# Patient Record
Sex: Female | Born: 1970
Health system: Southern US, Community
[De-identification: ages and names within clinical notes are randomized; demographics above are authoritative.]

## PROBLEM LIST (undated history)

## (undated) DIAGNOSIS — F419 Anxiety disorder, unspecified: Secondary | ICD-10-CM

## (undated) DIAGNOSIS — I10 Essential (primary) hypertension: Secondary | ICD-10-CM

## (undated) DIAGNOSIS — K219 Gastro-esophageal reflux disease without esophagitis: Secondary | ICD-10-CM

## (undated) DIAGNOSIS — R011 Cardiac murmur, unspecified: Secondary | ICD-10-CM

## (undated) HISTORY — PX: SPINE SURGERY: SHX786

## (undated) HISTORY — DX: Anxiety disorder, unspecified: F41.9

## (undated) HISTORY — DX: Essential (primary) hypertension: I10

## (undated) HISTORY — DX: Cardiac murmur, unspecified: R01.1

## (undated) HISTORY — DX: Gastro-esophageal reflux disease without esophagitis: K21.9

---

## 2001-09-20 ENCOUNTER — Ambulatory Visit (HOSPITAL_COMMUNITY): Admission: RE | Admit: 2001-09-20 | Discharge: 2001-09-20 | Payer: Self-pay | Admitting: Internal Medicine

## 2003-07-25 ENCOUNTER — Emergency Department (HOSPITAL_COMMUNITY): Admission: EM | Admit: 2003-07-25 | Discharge: 2003-07-25 | Payer: Self-pay | Admitting: Emergency Medicine

## 2008-03-08 ENCOUNTER — Ambulatory Visit (HOSPITAL_COMMUNITY): Admission: RE | Admit: 2008-03-08 | Discharge: 2008-03-08 | Payer: Self-pay | Admitting: Obstetrics and Gynecology

## 2008-05-29 ENCOUNTER — Encounter: Admission: RE | Admit: 2008-05-29 | Discharge: 2008-05-29 | Payer: Self-pay | Admitting: Obstetrics and Gynecology

## 2009-06-11 ENCOUNTER — Encounter: Admission: RE | Admit: 2009-06-11 | Discharge: 2009-06-11 | Payer: Self-pay | Admitting: Obstetrics and Gynecology

## 2009-12-26 ENCOUNTER — Ambulatory Visit (HOSPITAL_COMMUNITY): Admission: RE | Admit: 2009-12-26 | Discharge: 2009-12-26 | Payer: Self-pay | Admitting: Internal Medicine

## 2010-01-02 ENCOUNTER — Ambulatory Visit (HOSPITAL_COMMUNITY): Admission: RE | Admit: 2010-01-02 | Discharge: 2010-01-02 | Payer: Self-pay | Admitting: Internal Medicine

## 2011-02-13 NOTE — Procedures (Signed)
   NAMECHARLY, HUNTON                        ACCOUNT NO.:  [id number]   MEDICAL RECORD NO.:  [id number]                   PATIENT TYPE:  EMS   LOCATION:  ED                                   FACILITY:  [facility]   PHYSICIAN:  Edward L. Juanetta Gosling, M.D.             DATE OF BIRTH:  [birth year]   DATE OF PROCEDURE:  07/25/2003  DATE OF DISCHARGE:  07/25/2003                                EKG INTERPRETATION   TIME:  Is 3:09 on July 25, 2003.   The rhythm is sinus rhythm with a tachycardic rate of about 101.  Otherwise  normal electrocardiogram.      ___________________________________________                                            Oneal Deputy. Juanetta Gosling, M.D.   ELH/MEDQ  D:  07/25/2003  T:  07/25/2003  Job:  [id number]

## 2013-09-28 HISTORY — PX: BACK SURGERY: SHX140

## 2014-05-24 ENCOUNTER — Other Ambulatory Visit (HOSPITAL_COMMUNITY): Payer: Self-pay | Admitting: Physician Assistant

## 2014-05-24 DIAGNOSIS — M5430 Sciatica, unspecified side: Secondary | ICD-10-CM

## 2014-05-24 DIAGNOSIS — M479 Spondylosis, unspecified: Secondary | ICD-10-CM

## 2014-05-29 ENCOUNTER — Ambulatory Visit (HOSPITAL_COMMUNITY)
Admission: RE | Admit: 2014-05-29 | Discharge: 2014-05-29 | Disposition: A | Discharge status: Home / Self Care | Payer: BC Managed Care – PPO | Source: Ambulatory Visit | Attending: Physician Assistant | Admitting: Physician Assistant

## 2014-05-29 DIAGNOSIS — M479 Spondylosis, unspecified: Secondary | ICD-10-CM | POA: Diagnosis present

## 2014-05-29 DIAGNOSIS — M5126 Other intervertebral disc displacement, lumbar region: Secondary | ICD-10-CM | POA: Diagnosis not present

## 2014-05-29 DIAGNOSIS — N859 Noninflammatory disorder of uterus, unspecified: Secondary | ICD-10-CM | POA: Insufficient documentation

## 2014-05-29 DIAGNOSIS — M5430 Sciatica, unspecified side: Secondary | ICD-10-CM | POA: Diagnosis not present

## 2014-06-06 ENCOUNTER — Other Ambulatory Visit: Payer: Self-pay | Admitting: Neurosurgery

## 2014-06-06 DIAGNOSIS — M545 Low back pain, unspecified: Secondary | ICD-10-CM

## 2014-06-06 DIAGNOSIS — G8929 Other chronic pain: Secondary | ICD-10-CM

## 2014-06-08 ENCOUNTER — Other Ambulatory Visit: Payer: BC Managed Care – PPO

## 2014-06-14 ENCOUNTER — Other Ambulatory Visit: Payer: Self-pay | Admitting: Neurosurgery

## 2014-06-14 ENCOUNTER — Ambulatory Visit
Admission: RE | Admit: 2014-06-14 | Discharge: 2014-06-14 | Disposition: A | Discharge status: Home / Self Care | Payer: BC Managed Care – PPO | Source: Ambulatory Visit | Attending: Neurosurgery | Admitting: Neurosurgery

## 2014-06-14 VITALS — BP 127/87 | HR 72

## 2014-06-14 DIAGNOSIS — G8929 Other chronic pain: Secondary | ICD-10-CM

## 2014-06-14 DIAGNOSIS — M545 Low back pain, unspecified: Secondary | ICD-10-CM

## 2014-06-14 DIAGNOSIS — M5126 Other intervertebral disc displacement, lumbar region: Secondary | ICD-10-CM

## 2014-06-14 MED ORDER — IOHEXOL 180 MG/ML  SOLN
1.0000 mL | Freq: Once | INTRAMUSCULAR | Status: AC | PRN
Start: 2014-06-14 — End: 2014-06-14
  Administered 2014-06-14: 1 mL via EPIDURAL

## 2014-06-14 MED ORDER — METHYLPREDNISOLONE ACETATE 40 MG/ML INJ SUSP (RADIOLOG
120.0000 mg | Freq: Once | INTRAMUSCULAR | Status: AC
  Administered 2014-06-14: 120 mg via EPIDURAL

## 2014-06-14 NOTE — Discharge Instructions (Signed)

## 2014-07-09 ENCOUNTER — Other Ambulatory Visit: Payer: Self-pay | Admitting: Neurosurgery

## 2014-07-09 DIAGNOSIS — M5126 Other intervertebral disc displacement, lumbar region: Secondary | ICD-10-CM

## 2014-07-11 ENCOUNTER — Ambulatory Visit
Admission: RE | Admit: 2014-07-11 | Discharge: 2014-07-11 | Disposition: A | Discharge status: Home / Self Care | Payer: BC Managed Care – PPO | Source: Ambulatory Visit | Attending: Neurosurgery | Admitting: Neurosurgery

## 2014-07-11 DIAGNOSIS — M5126 Other intervertebral disc displacement, lumbar region: Secondary | ICD-10-CM

## 2014-07-11 MED ORDER — IOHEXOL 180 MG/ML  SOLN
1.0000 mL | Freq: Once | INTRAMUSCULAR | Status: AC | PRN
  Administered 2014-07-11: 1 mL via EPIDURAL

## 2014-07-11 MED ORDER — METHYLPREDNISOLONE ACETATE 40 MG/ML INJ SUSP (RADIOLOG
120.0000 mg | Freq: Once | INTRAMUSCULAR | Status: AC
  Administered 2014-07-11: 120 mg via EPIDURAL

## 2014-07-16 ENCOUNTER — Other Ambulatory Visit: Payer: BC Managed Care – PPO

## 2015-01-04 ENCOUNTER — Ambulatory Visit (HOSPITAL_COMMUNITY): Payer: BLUE CROSS/BLUE SHIELD | Attending: Neurosurgery | Admitting: Physical Therapy

## 2015-01-04 DIAGNOSIS — M5126 Other intervertebral disc displacement, lumbar region: Secondary | ICD-10-CM

## 2015-01-04 DIAGNOSIS — M6281 Muscle weakness (generalized): Secondary | ICD-10-CM | POA: Insufficient documentation

## 2015-01-04 DIAGNOSIS — M545 Low back pain: Secondary | ICD-10-CM | POA: Insufficient documentation

## 2015-01-04 DIAGNOSIS — R29898 Other symptoms and signs involving the musculoskeletal system: Secondary | ICD-10-CM

## 2015-01-04 NOTE — Therapy (Signed)
[facility] [address] [place], [place], [address] Phone: [phone]   Fax:  [phone]  Physical Therapy Evaluation  Patient Details  Name: Brittany Winters MRN: [id number] Date of Birth: 14-Apr-1971 Referring Provider:  Hilda Lias, MD  Encounter Date: 01/04/2015      PT End of Session - 01/04/15 1419    Visit Number 1   Number of Visits 8   Date for PT Re-Evaluation 03/05/15   Authorization Type BCBS   PT Start Time 1345   PT Stop Time 1430   PT Time Calculation (min) 45 min   Activity Tolerance Patient tolerated treatment well      No past medical history on file.  No past surgical history on file.  There were no vitals filed for this visit.  Visit Diagnosis:  HNP (herniated nucleus pulposus), lumbar  Weakness of left leg      Subjective Assessment - 01/04/15 1347    Subjective Pt states that she had back surgery on 09/26/14.  Initially the surgery helped but at  this time she states the pain and stiffness has incresed to pre-surgery level therefore she is being referred to skilled physical therapy.   How long can you sit comfortably? Pt states that prolong periods, (greater than 2 hours) increases her discomfort.    How long can you stand comfortably? no problem   How long can you walk comfortably? walking 45 minutes at least 3 times a week.    Patient Stated Goals to have less stiffness, learn exercises she can do to decrease her back pain.    Currently in Pain? No/denies  tightnes of low back             [facility] PT Assessment - 01/04/15 0001    Assessment   Medical Diagnosis HNP with surgery   Onset Date 09/26/14   Prior Therapy none after surgery    Precautions   Precautions None   Restrictions   Weight Bearing Restrictions No   Balance Screen   Has the patient fallen in the past 6 months No   Has the patient had a decrease in activity level because of a fear of falling?  No   Is the patient  reluctant to leave their home because of a fear of falling?  No   Prior Function   Level of Independence Independent with basic ADLs   Vocation Full time employment   Vocation Requirements 9 hours sitting, standing    Leisure walk, yoga    Cognition   Overall Cognitive Status Within Functional Limits for tasks assessed   Observation/Other Assessments   Focus on Therapeutic Outcomes (FOTO)  61   AROM   Lumbar Flexion decreased 20%   Lumbar Extension decreased 20%   Lumbar - Right Side Bend wfl   Lumbar - Left Side Bend wfl   Lumbar - Right Rotation decreased 20%   Lumbar - Left Rotation decreased 20   Strength   Left Hip Flexion 4/5   Left Hip Extension 3+/5   Left Hip ABduction 5/5   Left Knee Flexion 4/5   Left Knee Extension 3+/5   Left Ankle Dorsiflexion 3/5   Flexibility   Soft Tissue Assessment /Muscle Length yes   Hamstrings Lt 125                    [facility] Adult PT Treatment/Exercise - 01/04/15 0001    Exercises   Exercises Lumbar   Lumbar Exercises:  Stretches   Active Hamstring Stretch 2 reps;30 seconds   Active Hamstring Stretch Limitations supine   Single Knee to Chest Stretch 3 reps;30 seconds   Prone on Elbows Stretch 2 reps;60 seconds   Lumbar Exercises: Standing   Other Standing Lumbar Exercises 3 D hip excursion    Lumbar Exercises: Seated   Long Arc Quad on Chair Left;10 reps   Other Seated Lumbar Exercises toe raise x 10   Lumbar Exercises: Supine   Ab Set 10 reps   Bridge 10 reps                PT Education - 01/04/15 1418    Education provided Yes   Education Details body mechanics, strengthening and stretcihing exercises   Person(s) Educated Patient   Methods Explanation;Handout   Comprehension Verbalized understanding;Returned demonstration          PT Short Term Goals - 01/04/15 1422    PT SHORT TERM GOAL #1   Title I HEP   Time 1   Period Weeks   PT SHORT TERM GOAL #2   Title No complaint of stiffness   Time 2    Period Weeks   Status New           PT Long Term Goals - 01/04/15 1422    PT LONG TERM GOAL #1   Title I in advance HEP   Time 4   Period Weeks   PT LONG TERM GOAL #2   Title Pt able to demonstrate good body mechanics for lifting, pushing   Time 4   Period Weeks   PT LONG TERM GOAL #3   Title Pt LE strength to return to normal to reduce risk of reinjury    Time 4   Period Weeks               Plan - 01/04/15 1425    Clinical Impression Statement Ms. Brittany Winters is a 44 yo female who has had recent back surgery.  She was doing well but is starting to experience more pain and stiffness therefore her MD referred her to physical therapy.  Examination demonstrates decreased LE strength, decreased ROM and decreased knowledge on proper body mechanics.  Ms. Brittany Winters will benefit from skilled PT to address these issues and reduce the risk of  reinjury.     Pt will benefit from skilled therapeutic intervention in order to improve on the following deficits Decreased range of motion;Decreased strength;Improper body mechanics;Impaired flexibility   Rehab Potential Good   PT Frequency 2x / week   PT Duration 4 weeks   PT Treatment/Interventions Functional mobility training;Therapeutic activities;Therapeutic exercise;Balance training;Patient/family education   PT Next Visit Plan Begin standing hell raises; functional squats, prone heel squeeze, prone SLR, SAR pt will need pictures of these exercises;  Progress to t-band and lifting as well as yoga poses   PT Home Exercise Plan given   Consulted and Agree with Plan of Care Patient         Problem List There are no active problems to display for this patient.  Virgina OrganCynthia Russell, PT CLT [phone] 01/04/2015, 2:36 PM  [facility] [address] [place], [place], [address] Phone: [phone]   Fax:  [phone]

## 2015-01-04 NOTE — Patient Instructions (Signed)
Toe Raise (Sitting)   Raise toes, keeping heels on floor. Repeat ___20_ times per set. Do __1__ sets per session. Do 2____ sessions per day.  http://orth.exer.us/47   Copyright  VHI. All rights reserved.  Knee Extension (Sitting)   Place _0___ pound weight on left ankle and straighten knee fully, lower slowly. Repeat _15___ times per set. Do ___1_ sets per session. Do 2____ sessions per day.  http://orth.exer.us/732   Copyright  VHI. All rights reserved.  Knee-to-Chest Stretch: Unilateral   With hand behind right knee, pull knee in to chest until a comfortable stretch is felt in lower back and buttocks. Keep back relaxed. Hold _30___ seconds. Repeat __3__ times per set. Do _2___ sets per session. Do __3__ sessions per day.  http://orth.exer.us/126   Copyright  VHI. All rights reserved.  Hamstring Stretch: Active   Support behind right knee. Starting with knee bent, attempt to straighten knee until a comfortable stretch is felt in back of thigh. Hold __30__ seconds. Repeat __3__ times per set. Do __1__ sets per session. Do _2___ sessions per day.  http://orth.exer.us/158   Copyright  VHI. All rights reserved.  Isometric Abdominal   Lying on back with knees bent, tighten stomach by pressing elbows down. Hold __3__ seconds. Repeat _5-10___ times per set. Do __1__ sets per session. Do _2___ sessions per day.  http://orth.exer.us/1086   Copyright  VHI. All rights reserved.  Bridging   Slowly raise buttocks from floor, keeping stomach tight. Repeat _10___ times per set. Do _1___ sets per session. Do __2__ sessions per day.  http://orth.exer.us/1096   Copyright  VHI. All rights reserved.  On Elbows (Prone)   Rise up on elbows as high as possible, keeping hips on floor. Hold _180___ seconds. Repeat ____ times per set. Do __1__ sets per session. Do ___1_ sessions per day. 1 http://orth.exer.us/92   Copyright  VHI. All rights reserved.

## 2015-01-16 ENCOUNTER — Encounter (HOSPITAL_COMMUNITY): Payer: BLUE CROSS/BLUE SHIELD | Admitting: Physical Therapy

## 2015-01-23 ENCOUNTER — Ambulatory Visit (HOSPITAL_COMMUNITY): Payer: BLUE CROSS/BLUE SHIELD | Admitting: Physical Therapy

## 2015-01-23 ENCOUNTER — Telehealth (HOSPITAL_COMMUNITY): Payer: Self-pay | Admitting: Physical Therapy

## 2015-01-30 ENCOUNTER — Ambulatory Visit (HOSPITAL_COMMUNITY): Payer: BLUE CROSS/BLUE SHIELD | Attending: Neurosurgery | Admitting: Physical Therapy

## 2015-01-30 DIAGNOSIS — M545 Low back pain: Secondary | ICD-10-CM | POA: Insufficient documentation

## 2015-01-30 DIAGNOSIS — M5126 Other intervertebral disc displacement, lumbar region: Secondary | ICD-10-CM

## 2015-01-30 DIAGNOSIS — M6281 Muscle weakness (generalized): Secondary | ICD-10-CM | POA: Insufficient documentation

## 2015-01-30 DIAGNOSIS — R29898 Other symptoms and signs involving the musculoskeletal system: Secondary | ICD-10-CM

## 2015-01-30 NOTE — Therapy (Signed)
[facility] [address] [place], [place], [address] Phone: [phone]   Fax:  [phone]  Physical Therapy Treatment  Patient Details  Name: Brittany AlbrightMiranda P Nowaczyk MRN: [id number] Date of Birth: 04/22/1971 Referring Provider:  Hilda LiasBotero, Ernesto, MD  Encounter Date: 01/30/2015      PT End of Session - 01/30/15 0934    Visit Number 2   Number of Visits 8   Date for PT Re-Evaluation 03/05/15   Authorization Type BCBS   PT Start Time 0854   PT Stop Time 0937   PT Time Calculation (min) 43 min   Activity Tolerance Patient tolerated treatment well      No past medical history on file.  No past surgical history on file.  There were no vitals filed for this visit.  Visit Diagnosis:  HNP (herniated nucleus pulposus), lumbar  Weakness of left leg      Subjective Assessment - 01/30/15 0902    Subjective Pt states she has been doing her exercises and has no questions   Currently in Pain? No/denies               [facility] Adult PT Treatment/Exercise - 01/30/15 0001    Lumbar Exercises: Stretches   Active Hamstring Stretch 2 reps;30 seconds   Active Hamstring Stretch Limitations supine   Single Knee to Chest Stretch 3 reps;30 seconds   Prone on Elbows Stretch 2 reps;60 seconds   Press Ups 5 reps   Lumbar Exercises: Standing   Heel Raises 10 reps   Functional Squats 10 reps   Wall Slides 10 reps   Lumbar Exercises: Seated   Sit to Stand 10 reps   Lumbar Exercises: Prone   Single Arm Raise 10 reps   Straight Leg Raise 10 reps   Opposite Arm/Leg Raise Right arm/Left leg;Left arm/Right leg;10 reps                PT Education - 01/30/15 0934    Education provided Yes   Education Details for new exercises   Person(s) Educated Patient   Methods Explanation;Handout   Comprehension Verbalized understanding;Returned demonstration          PT Short Term Goals - 01/04/15 1422    PT SHORT TERM GOAL #1   Title I HEP   Time 1   Period Weeks   PT SHORT TERM GOAL #2   Title No complaint of stiffness   Time 2   Period Weeks   Status New           PT Long Term Goals - 01/04/15 1422    PT LONG TERM GOAL #1   Title I in advance HEP   Time 4   Period Weeks   PT LONG TERM GOAL #2   Title Pt able to demonstrate good body mechanics for lifting, pushing   Time 4   Period Weeks   PT LONG TERM GOAL #3   Title Pt LE strength to return to normal to reduce risk of reinjury    Time 4   Period Weeks            Plan - 01/30/15 [id number]    Clinical Impression Statement Pt added new prone exercises with minimal verbal and tactile cuing needed for proper technique.  Increased difficulty with transisitonal,(sit to stand) motion.  All exercises were facilitated by therapist to ensure proper technique.    Rehab Potential Good   PT Next Visit Plan begin postural t-band and all  4 exercises next treatment         Problem List There are no active problems to display for this patient. Virgina OrganCynthia Johnattan Strassman, PT CLT [phone] 01/30/2015, 9:37 AM  [facility] [address] [place], [place], [address] Phone: [phone]   Fax:  [phone]

## 2015-01-30 NOTE — Patient Instructions (Signed)
Heel Squeeze (Prone)   Abdomen supported, bend knees and gently squeeze heels together. Hold 3-5____ seconds. Repeat _10___ times per set. Do ____ sets per session. Do _1___ sessions per day.  http://orth.exer.us/1080   Copyright  VHI. All rights reserved.  Straight Leg Raise (Prone)   Abdomen and head supported, keep left knee locked and raise leg at hip. Avoid arching low back. Repeat 10____ times per set. Do __1__ sets per session. Do __2__ sessions per day.  http://orth.exer.us/1112   Copyright  VHI. All rights reserved.  Opposite Arm / Leg Lift (Prone)   Abdomen and head supported, left knee locked, raise leg and opposite arm __2__ inches from floor. Repeat __10__ times per set. Do _1___ sets per session. Do _10___ sessions per day.  http://orth.exer.us/1114   Copyright  VHI. All rights reserved.  Scapular Retraction: Flexion (Prone)   Lie with arms forward. Pinch shoulder blades together and raise arms a few inches from floor. Repeat _10___ times per set. Do ____ sets per session. Do __2__ sessions per day. 1 http://orth.exer.us/960   Copyright  VHI. All rights reserved.  Press-Up   Press upper body upward, keeping hips in contact with floor. Keep lower back and buttocks relaxed. Hold __2-3__ seconds. Repeat ___10_ times per set. Do __1__ sets per session. Do _1___ sessions per day.  http://orth.exer.us/94   Copyright  VHI. All rights reserved.  Sitting to Standing   With straight back, tighten stomach, place right leg back under chair, lean slightly forward and stand. Repeat __10_ times per set. Do ____1 sets per session. Do ___2_ sessions per day.  http://orth.exer.us/1140

## 2015-02-01 ENCOUNTER — Encounter (HOSPITAL_COMMUNITY): Payer: BLUE CROSS/BLUE SHIELD

## 2015-02-05 ENCOUNTER — Ambulatory Visit (HOSPITAL_COMMUNITY): Payer: BLUE CROSS/BLUE SHIELD | Admitting: Physical Therapy

## 2015-02-07 ENCOUNTER — Ambulatory Visit (HOSPITAL_COMMUNITY): Payer: BLUE CROSS/BLUE SHIELD | Admitting: Physical Therapy

## 2015-02-12 ENCOUNTER — Encounter (HOSPITAL_COMMUNITY): Payer: BLUE CROSS/BLUE SHIELD | Admitting: Physical Therapy

## 2015-02-14 ENCOUNTER — Ambulatory Visit (HOSPITAL_COMMUNITY): Payer: BLUE CROSS/BLUE SHIELD | Admitting: Physical Therapy

## 2015-02-19 ENCOUNTER — Ambulatory Visit (HOSPITAL_COMMUNITY): Payer: BLUE CROSS/BLUE SHIELD | Admitting: Physical Therapy

## 2015-02-19 DIAGNOSIS — M545 Low back pain: Secondary | ICD-10-CM | POA: Diagnosis not present

## 2015-02-19 DIAGNOSIS — M5126 Other intervertebral disc displacement, lumbar region: Secondary | ICD-10-CM

## 2015-02-19 DIAGNOSIS — R29898 Other symptoms and signs involving the musculoskeletal system: Secondary | ICD-10-CM

## 2015-02-19 NOTE — Therapy (Signed)
[facility] [address] [place], [place], [address] Phone: [phone]   Fax:  [phone]  Physical Therapy Treatment  Patient Details  Name: Brittany Winters MRN: [id number] Date of Birth: 1971/04/09 Referring Provider:  Leeroy Cha, MD  Encounter Date: 02/19/2015      PT End of Session - 02/19/15 1347    Visit Number 3   Number of Visits 8   Date for PT Re-Evaluation 03/05/15   Authorization Type BCBS   PT Start Time 8841   PT Stop Time 1345   PT Time Calculation (min) 42 min   Activity Tolerance Patient tolerated treatment well      No past medical history on file.  No past surgical history on file.  There were no vitals filed for this visit.  Visit Diagnosis:  HNP (herniated nucleus pulposus), lumbar  Weakness of left leg          [facility] PT Assessment - 02/19/15 0001    Observation/Other Assessments   Focus on Therapeutic Outcomes (FOTO)  66   Strength   Left Hip Flexion 5/5  was 4/5   Left Hip Extension 5/5  was3+/5   Left Hip ABduction 5/5   Left Knee Flexion 5/5  was 4/5    Left Knee Extension 5/5   Left Ankle Dorsiflexion 5/5  was 3/5   Flexibility   Hamstrings Lt 135 was 125                      [facility] Adult PT Treatment/Exercise - 02/19/15 0001    Lumbar Exercises: Stretches   Active Hamstring Stretch 3 reps;30 seconds   Active Hamstring Stretch Limitations supine   Single Knee to Chest Stretch 3 reps;30 seconds   Lumbar Exercises: Standing   Scapular Retraction Strengthening;Theraband   Theraband Level (Scapular Retraction) Level 4 (Blue)   Row Strengthening;Both;10 reps;Theraband   Theraband Level (Row) Level 4 (Blue)   Shoulder Extension Strengthening;Both;Theraband   Theraband Level (Shoulder Extension) Level 4 (Blue)   Lumbar Exercises: Prone   Opposite Arm/Leg Raise Right arm/Left leg;Left arm/Right leg;10 reps   Other Prone Lumbar Exercises Plank 10'"  5                  PT Education - 02/19/15 1347    Education Details for new exercises   Person(s) Educated Patient   Methods Explanation;Handout   Comprehension Verbalized understanding;Returned demonstration          PT Short Term Goals - 02/19/15 1326    PT SHORT TERM GOAL #1   Title I HEP   Time 1   Period Weeks   PT SHORT TERM GOAL #2   Title No complaint of stiffness   Time 2   Period Weeks   Status Achieved           PT Long Term Goals - 02/19/15 1327    PT LONG TERM GOAL #1   Title I in advance HEP   Time 4   Period Weeks   Status Achieved   PT LONG TERM GOAL #2   Title Pt able to demonstrate good body mechanics for lifting, pushing   Time 4   Period Weeks   Status Achieved   PT LONG TERM GOAL #3   Title Pt LE strength to return to normal to reduce risk of reinjury    Time 4   Period Weeks   Status Achieved  Plan - 02/19/15 1348    Clinical Impression Statement Reviewed postural t-band, lunging and plank with pt with manual and verbal cuing for proper technique and form.   Pt has met all goals and strengh has returned to normal.  Wlll discharge pt to HEP    PT Next Visit Plan D/C to HEP         Problem List There are no active problems to display for this patient.  Rayetta Humphrey, PT CLT [phone] 02/19/2015, 1:50 PM  PHYSICAL THERAPY DISCHARGE SUMMARY  Plan: Patient agrees to discharge.  Patient goals were met. Patient is being discharged due to meeting the stated rehab goals.  ?????     Rayetta Humphrey, [place] CLT [phone]

## 2015-02-19 NOTE — Patient Instructions (Signed)
Forward Lunge   Standing with feet shoulder width apart and stomach tight, step forward with left leg. Repeat 10____ times per set. Do ___q1_ sets per session. Do _2___ sessions per day.  http://orth.exer.us/1146   Copyright  VHI. All rights reserved.  Side Lunge   Stand with knees slightly bent, stomach tight. Step to side with right leg. Repeat _10___ times per set. Do ___1_ sets per session. Do __3__ sessions per day.  http://orth.exer.us/1148   Copyright  VHI. All rights reserved.

## 2015-07-05 NOTE — Telephone Encounter (Signed)
Called re: missed visits.  No answer  Virgina Organ, PT CLT [phone]

## 2016-03-10 DIAGNOSIS — Z6825 Body mass index (BMI) 25.0-25.9, adult: Secondary | ICD-10-CM | POA: Diagnosis not present

## 2016-03-10 DIAGNOSIS — Z01419 Encounter for gynecological examination (general) (routine) without abnormal findings: Secondary | ICD-10-CM | POA: Diagnosis not present

## 2016-03-10 DIAGNOSIS — Z1231 Encounter for screening mammogram for malignant neoplasm of breast: Secondary | ICD-10-CM | POA: Diagnosis not present

## 2016-05-26 DIAGNOSIS — Z1389 Encounter for screening for other disorder: Secondary | ICD-10-CM | POA: Diagnosis not present

## 2016-05-26 DIAGNOSIS — Z6823 Body mass index (BMI) 23.0-23.9, adult: Secondary | ICD-10-CM | POA: Diagnosis not present

## 2016-05-26 DIAGNOSIS — F419 Anxiety disorder, unspecified: Secondary | ICD-10-CM | POA: Diagnosis not present

## 2016-05-26 DIAGNOSIS — I10 Essential (primary) hypertension: Secondary | ICD-10-CM | POA: Diagnosis not present

## 2016-09-28 HISTORY — PX: ABLATION: SHX5711

## 2016-11-30 DIAGNOSIS — N921 Excessive and frequent menstruation with irregular cycle: Secondary | ICD-10-CM | POA: Diagnosis not present

## 2016-11-30 DIAGNOSIS — N924 Excessive bleeding in the premenopausal period: Secondary | ICD-10-CM | POA: Diagnosis not present

## 2017-01-06 DIAGNOSIS — D259 Leiomyoma of uterus, unspecified: Secondary | ICD-10-CM | POA: Diagnosis not present

## 2017-01-06 DIAGNOSIS — N858 Other specified noninflammatory disorders of uterus: Secondary | ICD-10-CM | POA: Diagnosis not present

## 2017-01-06 DIAGNOSIS — N920 Excessive and frequent menstruation with regular cycle: Secondary | ICD-10-CM | POA: Diagnosis not present

## 2017-01-14 DIAGNOSIS — N840 Polyp of corpus uteri: Secondary | ICD-10-CM | POA: Diagnosis not present

## 2017-01-14 DIAGNOSIS — N920 Excessive and frequent menstruation with regular cycle: Secondary | ICD-10-CM | POA: Diagnosis not present

## 2017-03-10 DIAGNOSIS — Z6823 Body mass index (BMI) 23.0-23.9, adult: Secondary | ICD-10-CM | POA: Diagnosis not present

## 2017-03-10 DIAGNOSIS — F419 Anxiety disorder, unspecified: Secondary | ICD-10-CM | POA: Diagnosis not present

## 2017-03-10 DIAGNOSIS — I10 Essential (primary) hypertension: Secondary | ICD-10-CM | POA: Diagnosis not present

## 2017-03-10 DIAGNOSIS — Z1389 Encounter for screening for other disorder: Secondary | ICD-10-CM | POA: Diagnosis not present

## 2017-03-10 DIAGNOSIS — R197 Diarrhea, unspecified: Secondary | ICD-10-CM | POA: Diagnosis not present

## 2017-05-26 ENCOUNTER — Encounter: Payer: Self-pay | Admitting: Family Medicine

## 2017-05-26 ENCOUNTER — Ambulatory Visit (INDEPENDENT_AMBULATORY_CARE_PROVIDER_SITE_OTHER): Payer: BLUE CROSS/BLUE SHIELD | Admitting: Family Medicine

## 2017-05-26 VITALS — BP 122/80 | HR 88 | Temp 98.2°F | Resp 18 | Ht 68.0 in | Wt 160.0 lb

## 2017-05-26 DIAGNOSIS — Z23 Encounter for immunization: Secondary | ICD-10-CM | POA: Diagnosis not present

## 2017-05-26 DIAGNOSIS — F410 Panic disorder [episodic paroxysmal anxiety] without agoraphobia: Secondary | ICD-10-CM | POA: Diagnosis not present

## 2017-05-26 DIAGNOSIS — K219 Gastro-esophageal reflux disease without esophagitis: Secondary | ICD-10-CM

## 2017-05-26 DIAGNOSIS — I10 Essential (primary) hypertension: Secondary | ICD-10-CM | POA: Diagnosis not present

## 2017-05-26 LAB — COMPLETE METABOLIC PANEL WITH GFR
ALT: 17 U/L (ref 6–29)
AST: 18 U/L (ref 10–30)
Albumin: 4.5 g/dL (ref 3.6–5.1)
Alkaline Phosphatase: 58 U/L (ref 33–115)
BUN: 7 mg/dL (ref 7–25)
CO2: 24 mmol/L (ref 20–32)
Calcium: 9 mg/dL (ref 8.6–10.2)
Chloride: 99 mmol/L (ref 98–110)
Creat: 0.64 mg/dL (ref 0.50–1.10)
GFR, Est African American: >89 mL/min (ref >=60)
GFR, Est Non African American: >89 mL/min (ref >=60)
Glucose, Bld: 77 mg/dL (ref 65–99)
Potassium: 3.9 mmol/L (ref 3.5–5.3)
Sodium: 135 mmol/L (ref 135–146)
Total Bilirubin: 0.4 mg/dL (ref 0.2–1.2)
Total Protein: 6.7 g/dL (ref 6.1–8.1)

## 2017-05-26 LAB — URINALYSIS, ROUTINE W REFLEX MICROSCOPIC
Bilirubin Urine: NEGATIVE
Glucose, UA: NEGATIVE
Hgb urine dipstick: NEGATIVE
Ketones, ur: NEGATIVE
Leukocytes, UA: NEGATIVE
Nitrite: NEGATIVE
Protein, ur: NEGATIVE
Specific Gravity, Urine: 1.018 (ref 1.001–1.035)
pH: 6 (ref 5.0–8.0)

## 2017-05-26 LAB — CBC
HCT: 35.9 % (ref 35.0–45.0)
Hemoglobin: 12.4 g/dL (ref 11.7–15.5)
MCH: 31 pg (ref 27.0–33.0)
MCHC: 34.5 g/dL (ref 32.0–36.0)
MCV: 89.8 fL (ref 80.0–100.0)
MPV: 9.4 fL (ref 7.5–12.5)
Platelets: 285 10*3/uL (ref 140–400)
RBC: 4 MIL/uL (ref 3.80–5.10)
RDW: 13.1 % (ref 11.0–15.0)
WBC: 2.8 10*3/uL — ABNORMAL LOW (ref 3.8–10.8)

## 2017-05-26 LAB — LIPID PANEL
Cholesterol: 169 mg/dL (ref <200)
HDL: 92 mg/dL (ref >50)
LDL Cholesterol: 66 mg/dL (ref <100)
Total CHOL/HDL Ratio: 1.8 Ratio (ref <5.0)
Triglycerides: 56 mg/dL (ref <150)
VLDL: 11 mg/dL (ref <30)

## 2017-05-26 MED ORDER — OMEPRAZOLE 20 MG PO CPDR: 20.0000 mg | DELAYED_RELEASE_CAPSULE | Freq: Every day | ORAL | 3 refills | Status: DC

## 2017-05-26 NOTE — Patient Instructions (Signed)
You are doing well Continue to eat well and stay active No change in medicines Need lab work I will notify you of your test results See me yearly Contact me sooner for questions or problems

## 2017-05-26 NOTE — Progress Notes (Signed)
Chief Complaint  Patient presents with  . Follow-up    est   Here as new patient Well controlled hypertension No labs in over a year Has a GYN visit scheduled for next week to update PAP and mammo Has GERD.  Takes daily omeprazole OTC.  Will prescribe.   She has a panic/anxiety disorder.  She takes prn xanax.  Takes about one a month.  Does not want daily medcine. Tries to eat well and exercise. No health concerns   Patient Active Problem List   Diagnosis Date Noted  . Essential hypertension 05/26/2017  . Panic attacks 05/26/2017  . GERD without esophagitis 05/26/2017    Outpatient Encounter Prescriptions as of 05/26/2017  Medication Sig  . ALPRAZolam (XANAX) 0.25 MG tablet   . losartan-hydrochlorothiazide (HYZAAR) 100-25 MG tablet Take 1 tablet by mouth daily.   . hydrochlorothiazide (HYDRODIURIL) 12.5 MG tablet Take by mouth.  Marland Kitchen. omeprazole (PRILOSEC) 20 MG capsule Take 1 capsule (20 mg total) by mouth daily.   No facility-administered encounter medications on file as of 05/26/2017.     Past Medical History:  Diagnosis Date  . Anxiety   . GERD (gastroesophageal reflux disease)   . Heart murmur   . Hypertension     Past Surgical History:  Procedure Laterality Date  . ABLATION  2018  . BACK SURGERY  2015  . SPINE SURGERY     herniated disc 2015    Social History   Social History  . Marital status: Married    Spouse name: Brittany Winters  . Number of children: 0  . Years of education: 15   Occupational History  . manager    Social History Main Topics  . Smoking status: Former Games developermoker  . Smokeless tobacco: Never Used     Comment: a little in college  . Alcohol use Yes  . Drug use: No  . Sexual activity: Yes    Birth control/ protection: Surgical   Other Topics Concern  . Not on file   Social History Narrative   Lives with Brittany Winters   Has "fur babies"   Likes to travel   walks    Family History  Problem Relation Age of Onset  . Arthritis Mother   .  Heart disease Maternal Grandmother   . Diabetes Paternal Grandfather     Review of Systems  Constitutional: Negative for chills, fever and weight loss.  HENT: Negative for congestion and hearing loss.   Eyes: Negative for blurred vision and pain.  Respiratory: Negative for cough and shortness of breath.   Cardiovascular: Negative for chest pain and leg swelling.  Gastrointestinal: Negative for abdominal pain, constipation, diarrhea and heartburn.  Genitourinary: Negative for dysuria and frequency.  Musculoskeletal: Negative for falls, joint pain and myalgias.  Neurological: Negative for dizziness, seizures and headaches.  Psychiatric/Behavioral: Negative for depression. The patient is not nervous/anxious and does not have insomnia.        Spells of anxiety    BP 122/80 (BP Location: Right Arm, Patient Position: Sitting, Cuff Size: Normal)   Pulse 88   Temp 98.2 F (36.8 C) (Temporal)   Resp 18   Ht 5\' 8"  (1.727 m)   Wt 160 lb (72.6 kg)   LMP 01/24/2017   SpO2 99%   BMI 24.33 kg/m   Physical Exam  Constitutional: She is oriented to person, place, and time. She appears well-developed and well-nourished.  HENT:  Head: Normocephalic and atraumatic.  Mouth/Throat: Oropharynx is clear and moist.  Eyes:  Pupils are equal, round, and reactive to light. Conjunctivae are normal.  Neck: Normal range of motion. Neck supple. No thyromegaly present.  Cardiovascular: Normal rate and regular rhythm.   Murmur heard. Faint systolic heart M  Pulmonary/Chest: Effort normal and breath sounds normal. No respiratory distress.  Abdominal: Soft. Bowel sounds are normal.  Musculoskeletal: Normal range of motion. She exhibits no edema.  Lymphadenopathy:    She has no cervical adenopathy.  Neurological: She is alert and oriented to person, place, and time.  Gait normal  Skin: Skin is warm and dry.  Psychiatric: She has a normal mood and affect. Her behavior is normal. Thought content normal.    Nursing note and vitals reviewed. ASSESSMENT/PLAN:   1. Essential hypertension - CBC - COMPLETE METABOLIC PANEL WITH GFR - Lipid panel - VITAMIN D 25 Hydroxy (Vit-D Deficiency, Fractures) - Urinalysis, Routine w reflex microscopic  2. Panic attacks Infrequent xanax  3. GERD without esophagitis Rx omeprazole  4. Need for influenza vaccination - Flu Vaccine QUAD 36+ mos IM   Patient Instructions  You are doing well Continue to eat well and stay active No change in medicines Need lab work I will notify you of your test results See me yearly Contact me sooner for questions or problems   Eustace Moore, MD

## 2017-05-27 ENCOUNTER — Encounter: Payer: Self-pay | Admitting: Family Medicine

## 2017-05-27 LAB — VITAMIN D 25 HYDROXY (VIT D DEFICIENCY, FRACTURES): Vit D, 25-Hydroxy: 36 ng/mL (ref 30–100)

## 2017-06-01 DIAGNOSIS — Z01419 Encounter for gynecological examination (general) (routine) without abnormal findings: Secondary | ICD-10-CM | POA: Diagnosis not present

## 2017-06-01 DIAGNOSIS — Z1231 Encounter for screening mammogram for malignant neoplasm of breast: Secondary | ICD-10-CM | POA: Diagnosis not present

## 2017-06-01 DIAGNOSIS — Z6825 Body mass index (BMI) 25.0-25.9, adult: Secondary | ICD-10-CM | POA: Diagnosis not present

## 2017-07-22 ENCOUNTER — Ambulatory Visit: Payer: BLUE CROSS/BLUE SHIELD | Admitting: Family Medicine

## 2018-03-04 ENCOUNTER — Encounter: Payer: Self-pay | Admitting: Family Medicine

## 2018-07-11 ENCOUNTER — Ambulatory Visit: Payer: Self-pay | Admitting: Nurse Practitioner

## 2018-07-11 VITALS — BP 122/78 | HR 109 | Temp 97.6°F | Resp 19 | Wt 161.4 lb

## 2018-07-11 DIAGNOSIS — J0110 Acute frontal sinusitis, unspecified: Secondary | ICD-10-CM

## 2018-07-11 MED ORDER — FLUTICASONE PROPIONATE 50 MCG/ACT NA SUSP: 2.0000 | Freq: Every day | NASAL | 0 refills | Status: DC

## 2018-07-11 MED ORDER — AMOXICILLIN-POT CLAVULANATE 875-125 MG PO TABS: 1.0000 | ORAL_TABLET | Freq: Two times a day (BID) | ORAL | 0 refills | Status: AC

## 2018-07-11 MED ORDER — BENZONATATE 200 MG PO CAPS: 200.0000 mg | ORAL_CAPSULE | Freq: Three times a day (TID) | ORAL | 0 refills | Status: AC | PRN

## 2018-07-11 NOTE — Patient Instructions (Addendum)
Sinusitis, Adult -Take medications as directed. -Ibuprofen or Tylenol for pain, fever, or general discomfort. -Increase fluids. -Use humidifier or vaporizer as needed for cough. -Sleep elevated on at least 2 pillows at bedtime. -May use a teaspoon of honey as needed for cough. -May use cough drops as needed for cough. -Follow-up within the next 48 to 72 hours if symptoms do not improve.   Sinusitis is soreness and inflammation of your sinuses. Sinuses are hollow spaces in the bones around your face. Your sinuses are located:  Around your eyes.  In the middle of your forehead.  Behind your nose.  In your cheekbones.  Your sinuses and nasal passages are lined with a stringy fluid (mucus). Mucus normally drains out of your sinuses. When your nasal tissues become inflamed or swollen, the mucus can become trapped or blocked so air cannot flow through your sinuses. This allows bacteria, viruses, and funguses to grow, which leads to infection. Sinusitis can develop quickly and last for 7?10 days (acute) or for more than 12 weeks (chronic). Sinusitis often develops after a cold. What are the causes? This condition is caused by anything that creates swelling in the sinuses or stops mucus from draining, including:  Allergies.  Asthma.  Bacterial or viral infection.  Abnormally shaped bones between the nasal passages.  Nasal growths that contain mucus (nasal polyps).  Narrow sinus openings.  Pollutants, such as chemicals or irritants in the air.  A foreign object stuck in the nose.  A fungal infection. This is rare.  What increases the risk? The following factors may make you more likely to develop this condition:  Having allergies or asthma.  Having had a recent cold or respiratory tract infection.  Having structural deformities or blockages in your nose or sinuses.  Having a weak immune system.  Doing a lot of swimming or diving.  Overusing nasal  sprays.  Smoking.  What are the signs or symptoms? The main symptoms of this condition are pain and a feeling of pressure around the affected sinuses. Other symptoms include:  Upper toothache.  Earache.  Headache.  Bad breath.  Decreased sense of smell and taste.  A cough that may get worse at night.  Fatigue.  Fever.  Thick drainage from your nose. The drainage is often green and it may contain pus (purulent).  Stuffy nose or congestion.  Postnasal drip. This is when extra mucus collects in the throat or back of the nose.  Swelling and warmth over the affected sinuses.  Sore throat.  Sensitivity to light.  How is this diagnosed? This condition is diagnosed based on symptoms, a medical history, and a physical exam. To find out if your condition is acute or chronic, your health care provider may:  Look in your nose for signs of nasal polyps.  Tap over the affected sinus to check for signs of infection.  View the inside of your sinuses using an imaging device that has a light attached (endoscope).  If your health care provider suspects that you have chronic sinusitis, you may also:  Be tested for allergies.  Have a sample of mucus taken from your nose (nasal culture) and checked for bacteria.  Have a mucus sample examined to see if your sinusitis is related to an allergy.  If your sinusitis does not respond to treatment and it lasts longer than 8 weeks, you may have an MRI or CT scan to check your sinuses. These scans also help to determine how severe your infection is.  In rare cases, a bone biopsy may be done to rule out more serious types of fungal sinus disease. How is this treated? Treatment for sinusitis depends on the cause and whether your condition is chronic or acute. If a virus is causing your sinusitis, your symptoms will go away on their own within 10 days. You may be given medicines to relieve your symptoms, including:  Topical nasal decongestants.  They shrink swollen nasal passages and let mucus drain from your sinuses.  Antihistamines. These drugs block inflammation that is triggered by allergies. This can help to ease swelling in your nose and sinuses.  Topical nasal corticosteroids. These are nasal sprays that ease inflammation and swelling in your nose and sinuses.  Nasal saline washes. These rinses can help to get rid of thick mucus in your nose.  If your condition is caused by bacteria, you will be given an antibiotic medicine. If your condition is caused by a fungus, you will be given an antifungal medicine. Surgery may be needed to correct underlying conditions, such as narrow nasal passages. Surgery may also be needed to remove polyps. Follow these instructions at home: Medicines  Take, use, or apply over-the-counter and prescription medicines only as told by your health care provider. These may include nasal sprays.  If you were prescribed an antibiotic medicine, take it as told by your health care provider. Do not stop taking the antibiotic even if you start to feel better. Hydrate and Humidify  Drink enough water to keep your urine clear or pale yellow. Staying hydrated will help to thin your mucus.  Use a cool mist humidifier to keep the humidity level in your home above 50%.  Inhale steam for 10-15 minutes, 3-4 times a day or as told by your health care provider. You can do this in the bathroom while a hot shower is running.  Limit your exposure to cool or dry air. Rest  Rest as much as possible.  Sleep with your head raised (elevated).  Make sure to get enough sleep each night. General instructions  Apply a warm, moist washcloth to your face 3-4 times a day or as told by your health care provider. This will help with discomfort.  Wash your hands often with soap and water to reduce your exposure to viruses and other germs. If soap and water are not available, use hand sanitizer.  Do not smoke. Avoid being  around people who are smoking (secondhand smoke).  Keep all follow-up visits as told by your health care provider. This is important. Contact a health care provider if:  You have a fever.  Your symptoms get worse.  Your symptoms do not improve within 10 days. Get help right away if:  You have a severe headache.  You have persistent vomiting.  You have pain or swelling around your face or eyes.  You have vision problems.  You develop confusion.  Your neck is stiff.  You have trouble breathing. This information is not intended to replace advice given to you by your health care provider. Make sure you discuss any questions you have with your health care provider. Document Released: 09/14/2005 Document Revised: 05/10/2016 Document Reviewed: 07/10/2015 Elsevier Interactive Patient Education  [employer].

## 2018-07-11 NOTE — Progress Notes (Signed)
Subjective:  Brittany Winters is a 47 y.o. female who presents for evaluation of possible sinusitis.  Symptoms include cough described as productive of yellow sputum, headache described as dull, nasal congestion, post nasal drip, productive cough with yellow colored sputum, sinus pressure, sinus pain, sore throat and fatigue.  Onset of symptoms was 2 weeks ago, and has been gradually worsening since that time.  Treatment to date:  Mucinex, Claritin, and netti pot.  High risk factors for influenza complications:  none.  The following portions of the patient's history were reviewed and updated as appropriate:  allergies, current medications and past medical history.  Constitutional: positive for anorexia and fatigue, negative for chills, fevers and sweats Eyes: negative Ears, nose, mouth, throat, and face: positive for nasal congestion, sore throat and bilateral ear fullness/pressure, negative for ear drainage, earaches and hoarseness Respiratory: positive for cough, sputum and wheezing, negative for asthma, chronic bronchitis, dyspnea on exertion, pleurisy/chest pain and pneumonia Cardiovascular: negative Gastrointestinal: positive for decreased appetite, nausea d/t postnasal drip, negative for abdominal pain, constipation and vomiting Neurological: positive for headaches, negative for coordination problems, dizziness, paresthesia, speech problems, tremors, vertigo and weakness Allergic/Immunologic: negative   Objective:  BP 122/78 (BP Location: Right Arm, Patient Position: Sitting, Cuff Size: Normal)   Pulse (!) 109   Temp 97.6 F (36.4 C) (Oral)   Resp 19   Wt 161 lb 6.4 oz (73.2 kg)   SpO2 98%   BMI 24.54 kg/m  General appearance: alert, cooperative, fatigued and no distress Head: Normocephalic, without obvious abnormality, atraumatic Eyes: conjunctivae/corneas clear. PERRL, EOM's intact. Fundi benign. Ears: abnormal TM right ear - mucoid middle ear fluid Nose: no discharge, moderate  congestion, turbinates swollen, inflamed, mild maxillary sinus tenderness bilateral, moderate frontal sinus tenderness bilateral Throat: lips, mucosa, and tongue normal; teeth and gums normal Lungs: clear to auscultation bilaterally Heart: regular rate and rhythm, S1, S2 normal, no murmur, click, rub or gallop Abdomen: soft, non-tender; bowel sounds normal; no masses,  no organomegaly Pulses: 2+ and symmetric Skin: Skin color, texture, turgor normal. No rashes or lesions Lymph nodes: cervical and submandibular nodes normal Neurologic: Grossly normal    Assessment:  Acute Frontal Sinusitis    Plan:   Exam findings, diagnosis etiology and medication use and indications reviewed with patient. Follow- Up and discharge instructions provided. No emergent/urgent issues found on exam. Patient education was provided. Patient verbalized understanding of information provided and agrees with plan of care (POC), all questions answered. The patient is advised to call or return to clinic if condition does not see an improvement in symptoms, or to seek the care of the closest emergency department if condition worsens with the above plan.   1. Acute frontal sinusitis, recurrence not specified  - amoxicillin-clavulanate (AUGMENTIN) 875-125 MG tablet; Take 1 tablet by mouth 2 (two) times daily for 10 days.  Dispense: 20 tablet; Refill: 0 - fluticasone (FLONASE) 50 MCG/ACT nasal spray; Place 2 sprays into both nostrils daily for 10 days.  Dispense: 16 g; Refill: 0 - benzonatate (TESSALON) 200 MG capsule; Take 1 capsule (200 mg total) by mouth 3 (three) times daily as needed for up to 10 days for cough.  Dispense: 30 capsule; Refill: 0 -Take medications as directed. -Ibuprofen or Tylenol for pain, fever, or general discomfort. -Increase fluids. -Use humidifier or vaporizer as needed for cough. -Sleep elevated on at least 2 pillows at bedtime. -May use a teaspoon of honey as needed for cough. -May use cough  drops as needed  for cough. -Follow-up within the next 48 to 72 hours if symptoms do not improve.

## 2018-07-13 ENCOUNTER — Telehealth: Payer: Self-pay

## 2018-07-13 NOTE — Telephone Encounter (Signed)
Patient did not answered the phone, I left a message asking to call us back.  

## 2018-08-16 DIAGNOSIS — Z6826 Body mass index (BMI) 26.0-26.9, adult: Secondary | ICD-10-CM | POA: Diagnosis not present

## 2018-08-16 DIAGNOSIS — Z1231 Encounter for screening mammogram for malignant neoplasm of breast: Secondary | ICD-10-CM | POA: Diagnosis not present

## 2018-08-16 DIAGNOSIS — Z01419 Encounter for gynecological examination (general) (routine) without abnormal findings: Secondary | ICD-10-CM | POA: Diagnosis not present

## 2018-10-06 DIAGNOSIS — Z1389 Encounter for screening for other disorder: Secondary | ICD-10-CM | POA: Diagnosis not present

## 2018-10-06 DIAGNOSIS — F419 Anxiety disorder, unspecified: Secondary | ICD-10-CM | POA: Diagnosis not present

## 2018-10-06 DIAGNOSIS — I10 Essential (primary) hypertension: Secondary | ICD-10-CM | POA: Diagnosis not present

## 2018-10-06 DIAGNOSIS — Z6824 Body mass index (BMI) 24.0-24.9, adult: Secondary | ICD-10-CM | POA: Diagnosis not present

## 2018-10-12 DIAGNOSIS — E785 Hyperlipidemia, unspecified: Secondary | ICD-10-CM | POA: Diagnosis not present

## 2018-10-12 DIAGNOSIS — Z0000 Encounter for general adult medical examination without abnormal findings: Secondary | ICD-10-CM | POA: Diagnosis not present

## 2018-10-12 DIAGNOSIS — I10 Essential (primary) hypertension: Secondary | ICD-10-CM | POA: Diagnosis not present

## 2019-05-03 DIAGNOSIS — F419 Anxiety disorder, unspecified: Secondary | ICD-10-CM | POA: Diagnosis not present

## 2019-07-13 DIAGNOSIS — F4321 Adjustment disorder with depressed mood: Secondary | ICD-10-CM | POA: Diagnosis not present

## 2019-07-18 DIAGNOSIS — F4321 Adjustment disorder with depressed mood: Secondary | ICD-10-CM | POA: Diagnosis not present

## 2019-08-02 DIAGNOSIS — F4321 Adjustment disorder with depressed mood: Secondary | ICD-10-CM | POA: Diagnosis not present

## 2019-08-16 DIAGNOSIS — F4321 Adjustment disorder with depressed mood: Secondary | ICD-10-CM | POA: Diagnosis not present

## 2019-08-29 DIAGNOSIS — F4321 Adjustment disorder with depressed mood: Secondary | ICD-10-CM | POA: Diagnosis not present

## 2019-09-19 DIAGNOSIS — F4321 Adjustment disorder with depressed mood: Secondary | ICD-10-CM | POA: Diagnosis not present

## 2019-10-04 DIAGNOSIS — F4321 Adjustment disorder with depressed mood: Secondary | ICD-10-CM | POA: Diagnosis not present

## 2019-10-09 DIAGNOSIS — Z113 Encounter for screening for infections with a predominantly sexual mode of transmission: Secondary | ICD-10-CM | POA: Diagnosis not present

## 2019-10-09 DIAGNOSIS — Z6824 Body mass index (BMI) 24.0-24.9, adult: Secondary | ICD-10-CM | POA: Diagnosis not present

## 2019-10-09 DIAGNOSIS — Z1231 Encounter for screening mammogram for malignant neoplasm of breast: Secondary | ICD-10-CM | POA: Diagnosis not present

## 2019-10-09 DIAGNOSIS — Z01419 Encounter for gynecological examination (general) (routine) without abnormal findings: Secondary | ICD-10-CM | POA: Diagnosis not present

## 2019-10-09 DIAGNOSIS — F419 Anxiety disorder, unspecified: Secondary | ICD-10-CM | POA: Diagnosis not present

## 2019-10-11 ENCOUNTER — Other Ambulatory Visit: Payer: Self-pay | Admitting: Obstetrics and Gynecology

## 2019-10-11 DIAGNOSIS — R928 Other abnormal and inconclusive findings on diagnostic imaging of breast: Secondary | ICD-10-CM

## 2019-10-18 DIAGNOSIS — F4321 Adjustment disorder with depressed mood: Secondary | ICD-10-CM | POA: Diagnosis not present

## 2019-10-20 ENCOUNTER — Ambulatory Visit
Admission: RE | Admit: 2019-10-20 | Discharge: 2019-10-20 | Disposition: A | Discharge status: Home / Self Care | Payer: BLUE CROSS/BLUE SHIELD | Source: Ambulatory Visit | Attending: Obstetrics and Gynecology | Admitting: Obstetrics and Gynecology

## 2019-10-20 ENCOUNTER — Ambulatory Visit: Payer: BLUE CROSS/BLUE SHIELD

## 2019-10-20 ENCOUNTER — Other Ambulatory Visit: Payer: Self-pay

## 2019-10-20 DIAGNOSIS — R928 Other abnormal and inconclusive findings on diagnostic imaging of breast: Secondary | ICD-10-CM

## 2019-10-31 DIAGNOSIS — F4321 Adjustment disorder with depressed mood: Secondary | ICD-10-CM | POA: Diagnosis not present

## 2019-11-14 DIAGNOSIS — F4321 Adjustment disorder with depressed mood: Secondary | ICD-10-CM | POA: Diagnosis not present

## 2019-11-23 DIAGNOSIS — L821 Other seborrheic keratosis: Secondary | ICD-10-CM | POA: Diagnosis not present

## 2019-11-23 DIAGNOSIS — Z1283 Encounter for screening for malignant neoplasm of skin: Secondary | ICD-10-CM | POA: Diagnosis not present

## 2019-12-20 DIAGNOSIS — F4321 Adjustment disorder with depressed mood: Secondary | ICD-10-CM | POA: Diagnosis not present

## 2020-01-11 DIAGNOSIS — F4321 Adjustment disorder with depressed mood: Secondary | ICD-10-CM | POA: Diagnosis not present

## 2020-02-01 DIAGNOSIS — F4321 Adjustment disorder with depressed mood: Secondary | ICD-10-CM | POA: Diagnosis not present

## 2020-02-21 DIAGNOSIS — F4321 Adjustment disorder with depressed mood: Secondary | ICD-10-CM | POA: Diagnosis not present

## 2020-03-13 DIAGNOSIS — F4321 Adjustment disorder with depressed mood: Secondary | ICD-10-CM | POA: Diagnosis not present

## 2020-04-03 DIAGNOSIS — F4321 Adjustment disorder with depressed mood: Secondary | ICD-10-CM | POA: Diagnosis not present

## 2020-04-17 DIAGNOSIS — F4321 Adjustment disorder with depressed mood: Secondary | ICD-10-CM | POA: Diagnosis not present

## 2020-05-22 DIAGNOSIS — F4321 Adjustment disorder with depressed mood: Secondary | ICD-10-CM | POA: Diagnosis not present

## 2020-06-12 DIAGNOSIS — F4321 Adjustment disorder with depressed mood: Secondary | ICD-10-CM | POA: Diagnosis not present

## 2020-06-13 ENCOUNTER — Ambulatory Visit: Payer: Self-pay

## 2020-07-18 DIAGNOSIS — F4321 Adjustment disorder with depressed mood: Secondary | ICD-10-CM | POA: Diagnosis not present

## 2020-08-15 DIAGNOSIS — F4321 Adjustment disorder with depressed mood: Secondary | ICD-10-CM | POA: Diagnosis not present

## 2020-09-09 DIAGNOSIS — E785 Hyperlipidemia, unspecified: Secondary | ICD-10-CM | POA: Diagnosis not present

## 2020-09-09 DIAGNOSIS — Z1389 Encounter for screening for other disorder: Secondary | ICD-10-CM | POA: Diagnosis not present

## 2020-09-09 DIAGNOSIS — I10 Essential (primary) hypertension: Secondary | ICD-10-CM | POA: Diagnosis not present

## 2020-09-09 DIAGNOSIS — Z13220 Encounter for screening for lipoid disorders: Secondary | ICD-10-CM | POA: Diagnosis not present

## 2020-09-09 DIAGNOSIS — F4322 Adjustment disorder with anxiety: Secondary | ICD-10-CM | POA: Diagnosis not present

## 2020-09-09 DIAGNOSIS — Z6822 Body mass index (BMI) 22.0-22.9, adult: Secondary | ICD-10-CM | POA: Diagnosis not present

## 2020-09-09 DIAGNOSIS — Z1331 Encounter for screening for depression: Secondary | ICD-10-CM | POA: Diagnosis not present

## 2020-09-12 DIAGNOSIS — F4321 Adjustment disorder with depressed mood: Secondary | ICD-10-CM | POA: Diagnosis not present

## 2020-11-13 DIAGNOSIS — Z01419 Encounter for gynecological examination (general) (routine) without abnormal findings: Secondary | ICD-10-CM | POA: Diagnosis not present

## 2020-11-13 DIAGNOSIS — Z6824 Body mass index (BMI) 24.0-24.9, adult: Secondary | ICD-10-CM | POA: Diagnosis not present

## 2020-11-13 DIAGNOSIS — Z1231 Encounter for screening mammogram for malignant neoplasm of breast: Secondary | ICD-10-CM | POA: Diagnosis not present

## 2021-07-14 IMAGING — MG MM DIGITAL DIAGNOSTIC UNILAT*L* W/ TOMO W/ CAD
6 series · 6 of 18 positions shown · non-contrast
Comparison: Previous exam(s).

CLINICAL DATA: Screening recall for a left breast asymmetry.

EXAM:
DIGITAL DIAGNOSTIC UNILATERAL LEFT MAMMOGRAM WITH CAD AND TOMO

[L ML synth-2D]
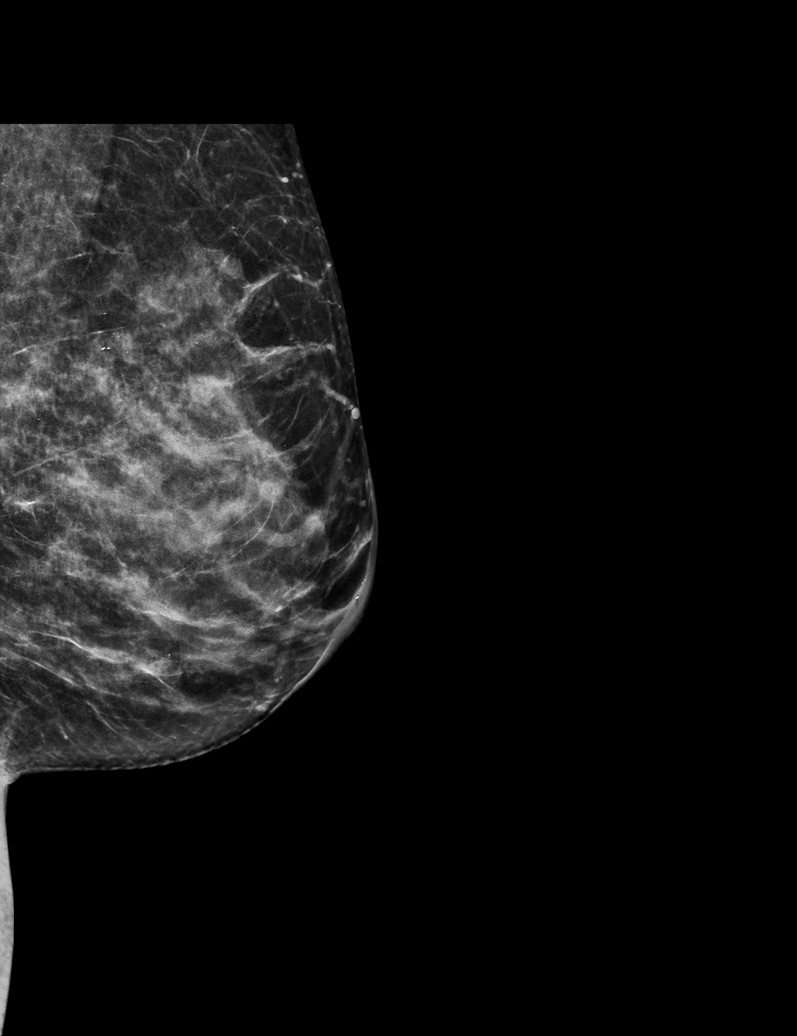

[L CC synth-2D (1 of 2)]
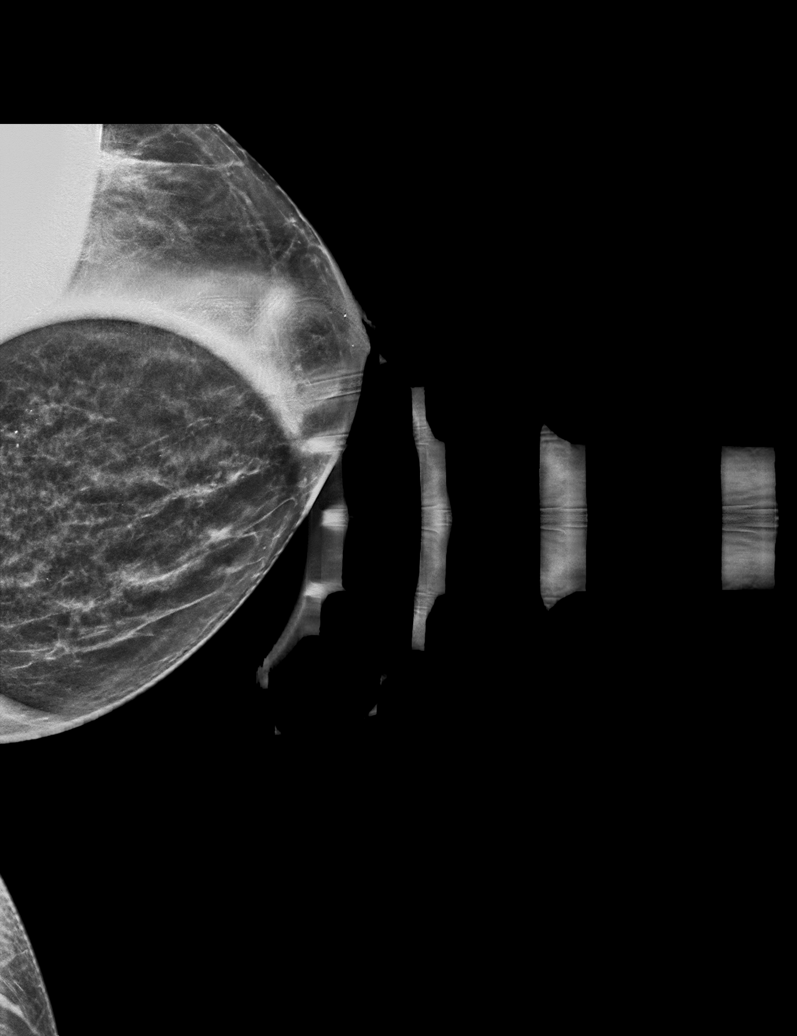

[L CC synth-2D (2 of 2)]
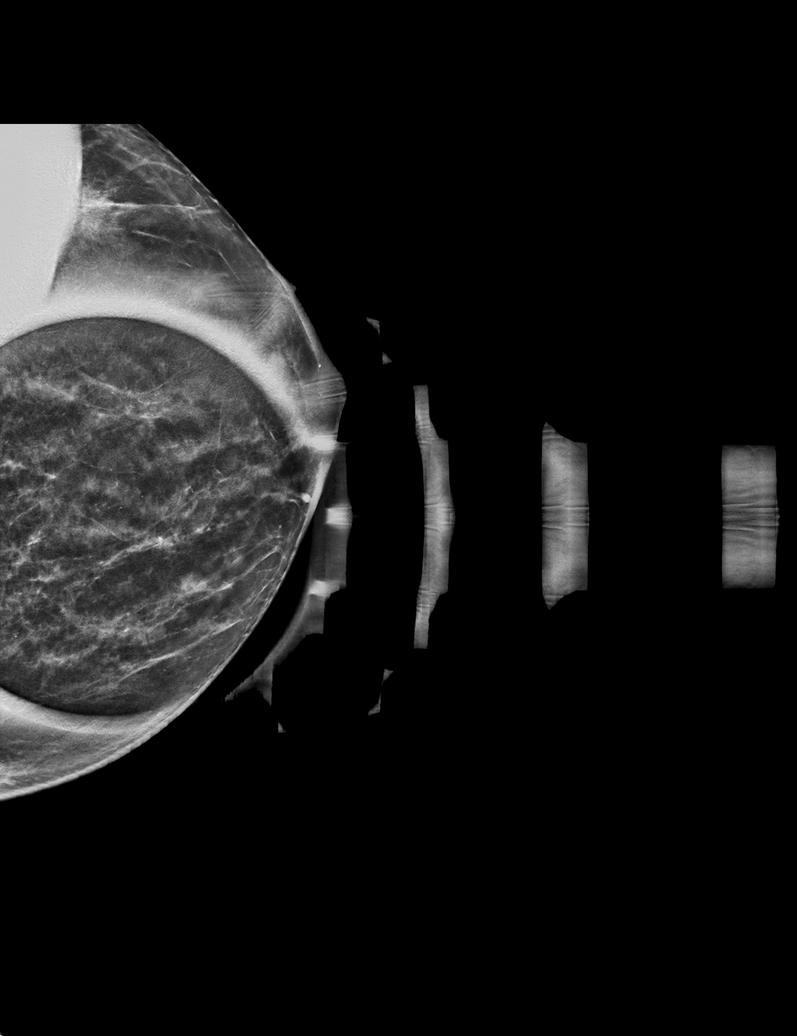

[L CC tomo (1 of 2) · tomo slice 25/49.0]
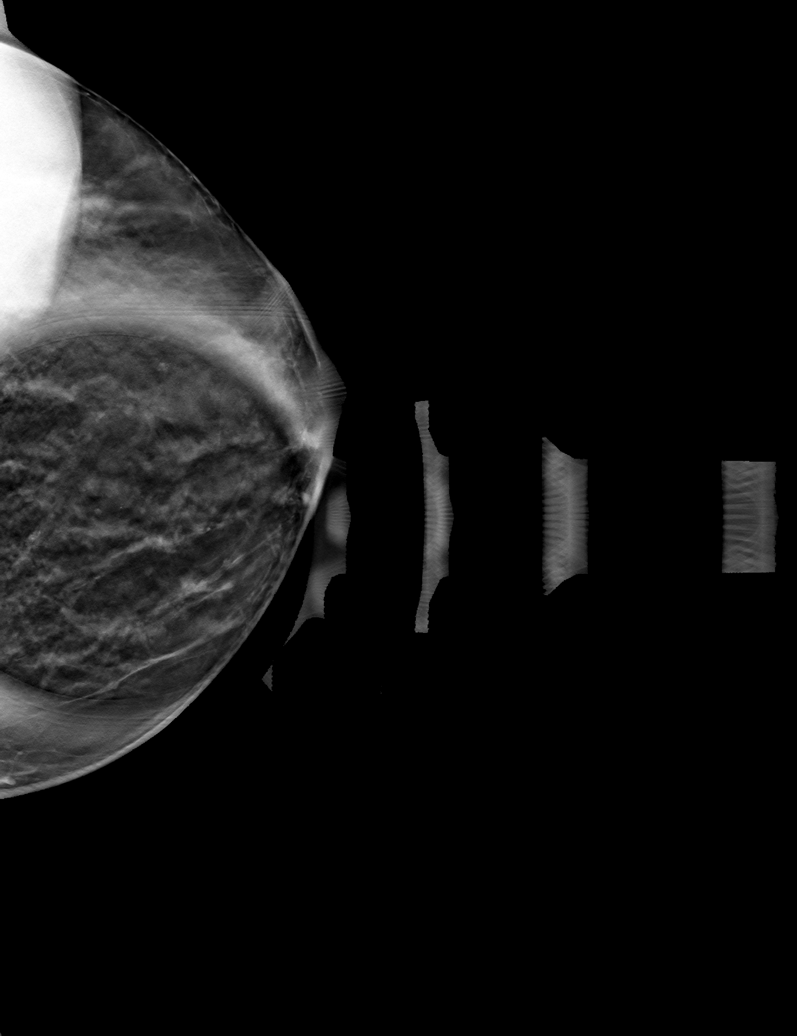

[L ML tomo · tomo slice 30/59.0]
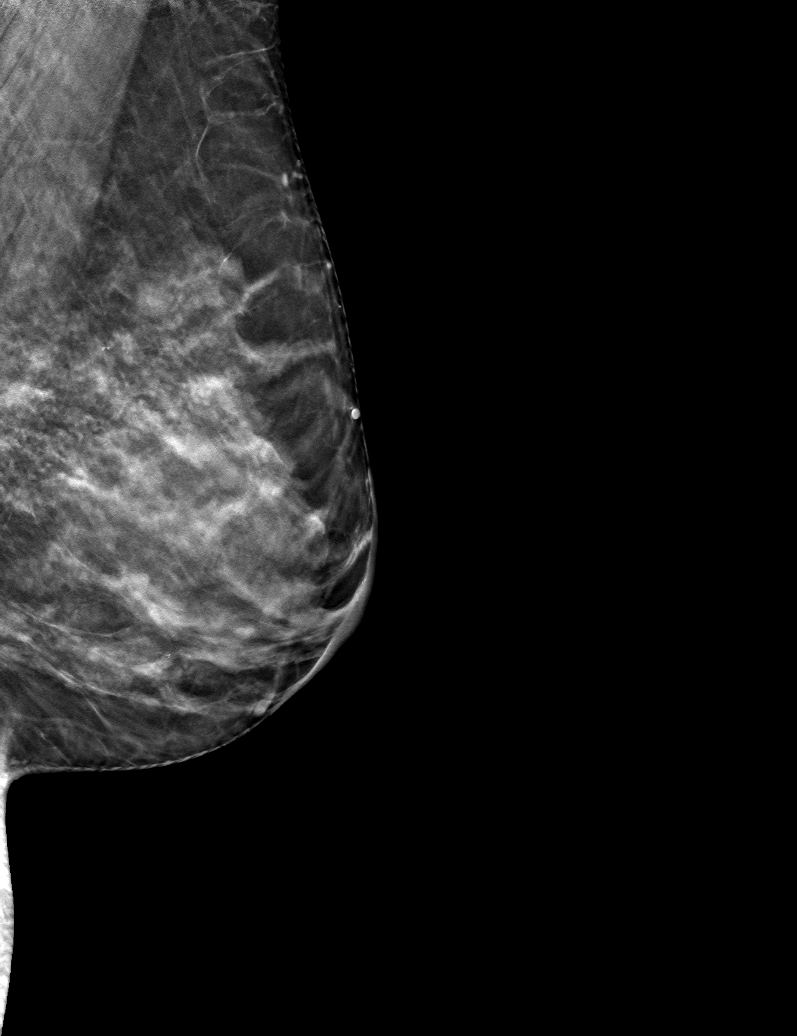

[L CC tomo (2 of 2) · tomo slice 26/51.0]
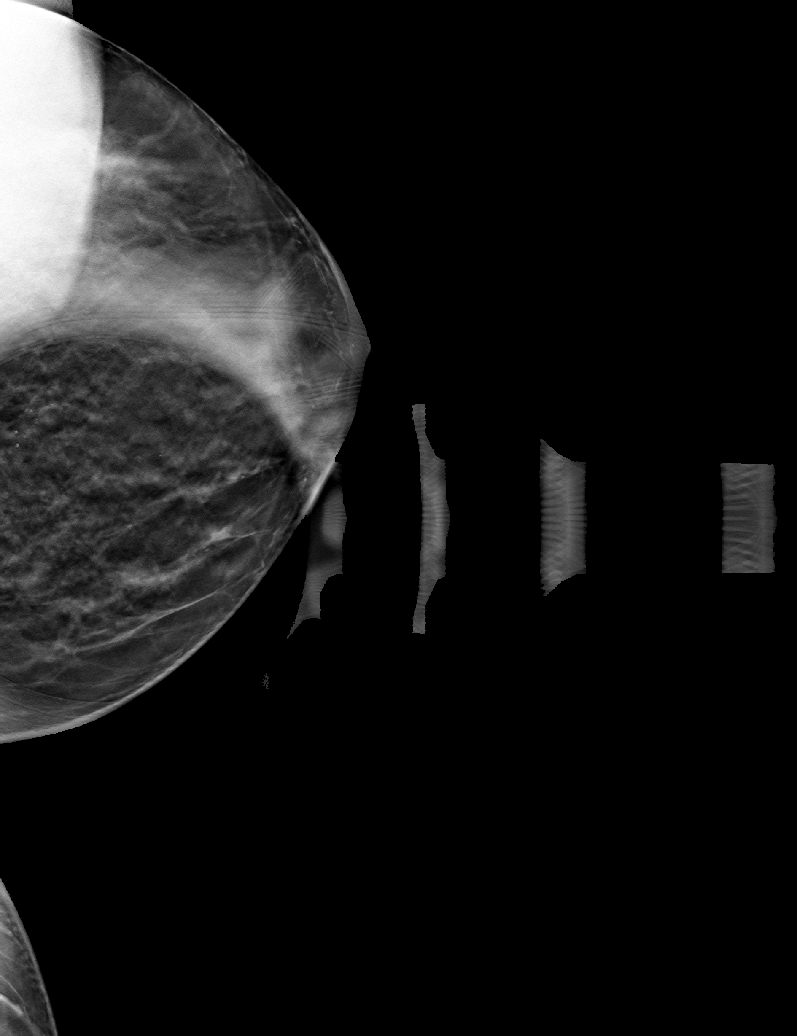

[6 of 18 positions shown; findings below may reference images not displayed]

ACR Breast Density Category c: The breast tissue is heterogeneously
dense, which may obscure small masses.
FINDINGS: The asymmetry in the central to slightly medial posterior left
breast resolves on spot compression CC projection tomosynthesis
imaging. No suspicious findings are seen on the full paddle true
lateral tomosynthesis images.

Mammographic images were processed with CAD.
IMPRESSION: Resolution of the asymmetry in the left breast consistent with
overlapping fibroglandular tissue.

RECOMMENDATION:
Screening mammogram in one year.(Code:[id number]-[id number])

I have discussed the findings and recommendations with the patient.
If applicable, a reminder letter will be sent to the patient
regarding the next appointment.

BI-RADS CATEGORY  1: Negative.

## 2021-10-16 DIAGNOSIS — Z23 Encounter for immunization: Secondary | ICD-10-CM | POA: Diagnosis not present

## 2021-10-16 DIAGNOSIS — Z1331 Encounter for screening for depression: Secondary | ICD-10-CM | POA: Diagnosis not present

## 2021-10-16 DIAGNOSIS — M25562 Pain in left knee: Secondary | ICD-10-CM | POA: Diagnosis not present

## 2021-10-16 DIAGNOSIS — Z6824 Body mass index (BMI) 24.0-24.9, adult: Secondary | ICD-10-CM | POA: Diagnosis not present

## 2021-10-16 DIAGNOSIS — K219 Gastro-esophageal reflux disease without esophagitis: Secondary | ICD-10-CM | POA: Diagnosis not present

## 2021-10-16 DIAGNOSIS — I10 Essential (primary) hypertension: Secondary | ICD-10-CM | POA: Diagnosis not present

## 2021-10-16 DIAGNOSIS — F419 Anxiety disorder, unspecified: Secondary | ICD-10-CM | POA: Diagnosis not present

## 2021-10-16 DIAGNOSIS — E663 Overweight: Secondary | ICD-10-CM | POA: Diagnosis not present

## 2021-11-21 DIAGNOSIS — F419 Anxiety disorder, unspecified: Secondary | ICD-10-CM | POA: Diagnosis not present

## 2021-11-21 DIAGNOSIS — I10 Essential (primary) hypertension: Secondary | ICD-10-CM | POA: Diagnosis not present

## 2021-12-24 DIAGNOSIS — Z124 Encounter for screening for malignant neoplasm of cervix: Secondary | ICD-10-CM | POA: Diagnosis not present

## 2021-12-24 DIAGNOSIS — Z01419 Encounter for gynecological examination (general) (routine) without abnormal findings: Secondary | ICD-10-CM | POA: Diagnosis not present

## 2021-12-24 DIAGNOSIS — Z1231 Encounter for screening mammogram for malignant neoplasm of breast: Secondary | ICD-10-CM | POA: Diagnosis not present

## 2021-12-24 DIAGNOSIS — Z6826 Body mass index (BMI) 26.0-26.9, adult: Secondary | ICD-10-CM | POA: Diagnosis not present

## 2021-12-31 ENCOUNTER — Encounter (INDEPENDENT_AMBULATORY_CARE_PROVIDER_SITE_OTHER): Payer: Self-pay | Admitting: *Deleted

## 2022-06-23 DIAGNOSIS — K648 Other hemorrhoids: Secondary | ICD-10-CM | POA: Diagnosis not present

## 2022-06-23 DIAGNOSIS — D125 Benign neoplasm of sigmoid colon: Secondary | ICD-10-CM | POA: Diagnosis not present

## 2022-06-23 DIAGNOSIS — K5730 Diverticulosis of large intestine without perforation or abscess without bleeding: Secondary | ICD-10-CM | POA: Diagnosis not present

## 2022-06-23 DIAGNOSIS — D128 Benign neoplasm of rectum: Secondary | ICD-10-CM | POA: Diagnosis not present

## 2022-06-23 DIAGNOSIS — Z1211 Encounter for screening for malignant neoplasm of colon: Secondary | ICD-10-CM | POA: Diagnosis not present

## 2022-06-23 DIAGNOSIS — K644 Residual hemorrhoidal skin tags: Secondary | ICD-10-CM | POA: Diagnosis not present

## 2022-06-30 ENCOUNTER — Encounter (INDEPENDENT_AMBULATORY_CARE_PROVIDER_SITE_OTHER): Payer: Self-pay | Admitting: *Deleted

## 2022-08-03 ENCOUNTER — Encounter: Payer: Self-pay | Admitting: Internal Medicine

## 2022-08-03 ENCOUNTER — Ambulatory Visit: Payer: BC Managed Care – PPO | Admitting: Internal Medicine

## 2022-08-03 VITALS — BP 122/76 | HR 76 | Ht 65.5 in | Wt 159.1 lb

## 2022-08-03 DIAGNOSIS — Z23 Encounter for immunization: Secondary | ICD-10-CM

## 2022-08-03 DIAGNOSIS — J0110 Acute frontal sinusitis, unspecified: Secondary | ICD-10-CM

## 2022-08-03 DIAGNOSIS — J302 Other seasonal allergic rhinitis: Secondary | ICD-10-CM

## 2022-08-03 DIAGNOSIS — I10 Essential (primary) hypertension: Secondary | ICD-10-CM

## 2022-08-03 DIAGNOSIS — F410 Panic disorder [episodic paroxysmal anxiety] without agoraphobia: Secondary | ICD-10-CM | POA: Diagnosis not present

## 2022-08-03 MED ORDER — FLUTICASONE PROPIONATE 50 MCG/ACT NA SUSP: 2.0000 | Freq: Every day | NASAL | 2 refills | Status: DC

## 2022-08-03 NOTE — Patient Instructions (Signed)
Thank you for trusting me with your care. To recap, today we discussed the following:   Flu vaccine need - Flu Vaccine QUAD 6+ mos PF IM (Fluarix Quad PF)  Refilled - fluticasone (FLONASE) 50 MCG/ACT nasal spray; Place 2 sprays into both nostrils daily.  Dispense: 16 g; Refill: 2

## 2022-08-03 NOTE — Progress Notes (Unsigned)
CC: Establish Care (Transferring from [facility], would like to establish care. )     HPI:Brittany Winters is a 51 y.o. female who presents to establish care. For the details of today's visit, please refer to the assessment and plan.  Past Medical History:  Diagnosis Date   Anxiety    GERD (gastroesophageal reflux disease)    Heart murmur    Hypertension     Past Surgical History:  Procedure Laterality Date   ABLATION  2018   BACK SURGERY  2015   SPINE SURGERY     herniated disc 2015    Family History  Problem Relation Age of Onset   Arthritis Mother    Heart disease Maternal Grandmother    Diabetes Paternal Grandfather     Social History   Tobacco Use   Smoking status: Former   Smokeless tobacco: Never   Tobacco comments:    a little in Therapist, art Use: Never used  Substance Use Topics   Alcohol use: Yes   Drug use: No    Review of Systems:    Review of Systems  Neurological:  Negative for dizziness.  Psychiatric/Behavioral:  Negative for depression. The patient is not nervous/anxious.      Physical Exam: Vitals:   08/03/22 1642  BP: 122/76  Pulse: 76  SpO2: 99%  Weight: 159 lb 1.9 oz (72.2 kg)  Height: 5' 5.5" (1.664 m)     Physical Exam Constitutional:      General: She is not in acute distress.    Appearance: Normal appearance.  HENT:     Head: Normocephalic and atraumatic.     Nose: No congestion or rhinorrhea.     Mouth/Throat:     Pharynx: No oropharyngeal exudate or posterior oropharyngeal erythema.  Eyes:     Extraocular Movements: Extraocular movements intact.     Conjunctiva/sclera: Conjunctivae normal.  Cardiovascular:     Rate and Rhythm: Normal rate and regular rhythm.     Heart sounds: No murmur heard.    No friction rub. No gallop.  Pulmonary:     Effort: Pulmonary effort is normal.     Breath sounds: Normal breath sounds. No wheezing, rhonchi or rales.  Musculoskeletal:        General:  No deformity or signs of injury.     Right lower leg: No edema.     Left lower leg: No edema.  Skin:    General: Skin is warm and dry.     Findings: No erythema or rash.  Neurological:     Mental Status: She is oriented to person, place, and time.     Motor: No weakness.  Psychiatric:        Mood and Affect: Mood normal.        Behavior: Behavior normal.      Assessment & Plan:   Essential hypertension Patient's BP today is 122/76 with a goal of <140/80. The patient endorses adherence to olmesartan-HCTZ 40-25. No lightheadedness, weakness, dizziness on standing, swelling in the feet or ankles.   Assessment/Plan: Primary hypertension, controlled on current medication. Patient is normal weight, and adhering to low salt diet.  Continue Olmesartan-HCTZ 40-25 Check BMP at next visit for routine monitoring.  Panic attacks History of panic attacks treated with Xanax. She had more panic attacks and difficulty sleeping when going through divorce. Her panic attacks are less frequent now. PDMP reviewed and last filled 09/2021.   Seasonal allergies Refilled  medication as below for seasonal allergies. - fluticasone (FLONASE) 50 MCG/ACT nasal spray; Place 2 sprays into both nostrils daily.  Dispense: 16 g; Refill: 2     Lorene Dy, MD

## 2022-08-04 DIAGNOSIS — J302 Other seasonal allergic rhinitis: Secondary | ICD-10-CM | POA: Insufficient documentation

## 2022-08-04 NOTE — Assessment & Plan Note (Signed)
Patient's BP today is 122/76 with a goal of <140/80. The patient endorses adherence to olmesartan-HCTZ 40-25. No lightheadedness, weakness, dizziness on standing, swelling in the feet or ankles.   Assessment/Plan: Primary hypertension, controlled on current medication. Patient is normal weight, and adhering to low salt diet.  Continue Olmesartan-HCTZ 40-25 Check BMP at next visit for routine monitoring.

## 2022-08-04 NOTE — Assessment & Plan Note (Addendum)
Refilled medication as below for seasonal allergies. - fluticasone (FLONASE) 50 MCG/ACT nasal spray; Place 2 sprays into both nostrils daily.  Dispense: 16 g; Refill: 2

## 2022-08-04 NOTE — Assessment & Plan Note (Signed)
History of panic attacks treated with Xanax. She had more panic attacks and difficulty sleeping when going through divorce. Her panic attacks are less frequent now. PDMP reviewed and last filled 09/2021.

## 2022-09-14 DIAGNOSIS — K5730 Diverticulosis of large intestine without perforation or abscess without bleeding: Secondary | ICD-10-CM | POA: Diagnosis not present

## 2022-09-14 DIAGNOSIS — Z86018 Personal history of other benign neoplasm: Secondary | ICD-10-CM | POA: Diagnosis not present

## 2022-09-14 DIAGNOSIS — K648 Other hemorrhoids: Secondary | ICD-10-CM | POA: Diagnosis not present

## 2022-09-14 DIAGNOSIS — K6289 Other specified diseases of anus and rectum: Secondary | ICD-10-CM | POA: Diagnosis not present

## 2022-11-03 ENCOUNTER — Ambulatory Visit: Payer: BC Managed Care – PPO | Admitting: Family Medicine

## 2022-11-03 ENCOUNTER — Encounter: Payer: Self-pay | Admitting: Family Medicine

## 2022-11-03 VITALS — BP 130/82 | HR 72 | Ht 65.0 in | Wt 164.0 lb

## 2022-11-03 DIAGNOSIS — K219 Gastro-esophageal reflux disease without esophagitis: Secondary | ICD-10-CM

## 2022-11-03 DIAGNOSIS — Z1159 Encounter for screening for other viral diseases: Secondary | ICD-10-CM

## 2022-11-03 DIAGNOSIS — R21 Rash and other nonspecific skin eruption: Secondary | ICD-10-CM

## 2022-11-03 DIAGNOSIS — F410 Panic disorder [episodic paroxysmal anxiety] without agoraphobia: Secondary | ICD-10-CM

## 2022-11-03 DIAGNOSIS — R7989 Other specified abnormal findings of blood chemistry: Secondary | ICD-10-CM

## 2022-11-03 DIAGNOSIS — G479 Sleep disorder, unspecified: Secondary | ICD-10-CM | POA: Insufficient documentation

## 2022-11-03 DIAGNOSIS — Z114 Encounter for screening for human immunodeficiency virus [HIV]: Secondary | ICD-10-CM

## 2022-11-03 DIAGNOSIS — I10 Essential (primary) hypertension: Secondary | ICD-10-CM

## 2022-11-03 DIAGNOSIS — E7800 Pure hypercholesterolemia, unspecified: Secondary | ICD-10-CM

## 2022-11-03 DIAGNOSIS — E559 Vitamin D deficiency, unspecified: Secondary | ICD-10-CM

## 2022-11-03 DIAGNOSIS — R7301 Impaired fasting glucose: Secondary | ICD-10-CM

## 2022-11-03 MED ORDER — TRIAMCINOLONE ACETONIDE 0.1 % EX CREA: 1.0000 | TOPICAL_CREAM | Freq: Every evening | CUTANEOUS | 0 refills | Status: DC | PRN

## 2022-11-03 MED ORDER — HYDROXYZINE PAMOATE 25 MG PO CAPS: 25.0000 mg | ORAL_CAPSULE | Freq: Three times a day (TID) | ORAL | 1 refills | Status: DC | PRN

## 2022-11-03 NOTE — Patient Instructions (Addendum)
I appreciate the opportunity to provide care to you today!    Follow up:  3 months   Labs: please stop by the lab today to get your blood drawn (CBC, CMP, TSH, Lipid profile, HgA1c, Vit D)  Screening: HIV and Hep C  Please come back for a nurse visit for TDAP Vaccine  Please pick up your prescriptions at the pharmacy   Please continue to a heart-healthy diet and increase your physical activities. Try to exercise for 33mins at least five times a week.      It was a pleasure to see you and I look forward to continuing to work together on your health and well-being. Please do not hesitate to call the office if you need care or have questions about your care.   Have a wonderful day and week. With Gratitude, Alvira Monday MSN, FNP-BC

## 2022-11-03 NOTE — Progress Notes (Unsigned)
Established Patient Office Visit  Subjective:  Patient ID: Brittany Winters, female    DOB: [birth year]  Age: 52 y.o. MRN: [id number]  CC:  Chief Complaint  Patient presents with   Follow-up    3 month follow up from seeing Dr. Court Joy for new pt appt. Pt reports rash started in January still has some spots on her left arm. Also having trouble staying asleep ongoing for about a year.     HPI Brittany Winters is a 52 y.o. female with past medical history of essential hypertension, GERD, panic attacks presents for f/u of  chronic medical conditions. For the details of today's visit, please refer to the assessment and plan.     Past Medical History:  Diagnosis Date   Anxiety    GERD (gastroesophageal reflux disease)    Heart murmur    Hypertension     Past Surgical History:  Procedure Laterality Date   ABLATION  2018   BACK SURGERY  2015   SPINE SURGERY     herniated disc 2015    Family History  Problem Relation Age of Onset   Arthritis Mother    Heart disease Maternal Grandmother    Diabetes Paternal Grandfather     Social History   Socioeconomic History   Marital status: Married    Spouse name: Stacy   Number of children: 0   Years of education: 15   Highest education level: Not on file  Occupational History   Occupation: Freight forwarder  Tobacco Use   Smoking status: Former   Smokeless tobacco: Never   Tobacco comments:    a little in Charity fundraiser Use: Never used  Substance and Sexual Activity   Alcohol use: Yes   Drug use: No   Sexual activity: Yes    Birth control/protection: Surgical  Other Topics Concern   Not on file  Social History Narrative   Lives with Marzetta Board   Has "fur babies"   Likes to travel   walks   Social Determinants of Health   Financial Resource Strain: Not on file  Food Insecurity: Not on file  Transportation Needs: Not on file  Physical Activity: Not on file  Stress: Not on file  Social Connections: Not on file   Intimate Partner Violence: Not on file    Outpatient Medications Prior to Visit  Medication Sig Dispense Refill   ALPRAZolam (XANAX) 0.25 MG tablet      Cyanocobalamin (B-12 PO) Take by mouth.     fluticasone (FLONASE) 50 MCG/ACT nasal spray Place 2 sprays into both nostrils daily. 16 g 2   olmesartan-hydrochlorothiazide (BENICAR HCT) 40-25 MG tablet Take 1 tablet by mouth daily.     omeprazole (PRILOSEC) 20 MG capsule Take 1 capsule (20 mg total) by mouth daily. 90 capsule 3   VITAMIN E COMPLEX PO Take by mouth.     Zinc Acetate, Oral, (ZINC ACETATE PO) Take by mouth.     No facility-administered medications prior to visit.    No Known Allergies  ROS Review of Systems  Constitutional:  Negative for chills and fever.  Eyes:  Negative for visual disturbance.  Respiratory:  Negative for chest tightness and shortness of breath.   Skin:  Positive for rash.  Neurological:  Negative for dizziness and headaches.  Psychiatric/Behavioral:  Positive for sleep disturbance.       Objective:    Physical Exam HENT:     Head: Normocephalic.     Mouth/Throat:  Mouth: Mucous membranes are moist.  Cardiovascular:     Rate and Rhythm: Normal rate.     Heart sounds: Normal heart sounds.  Pulmonary:     Effort: Pulmonary effort is normal.     Breath sounds: Normal breath sounds.  Skin:    Findings: Rash (on abdomen and upper left arm) present.  Neurological:     Mental Status: She is alert.     BP 130/82   Pulse 72   Ht 5\' 5"  (1.651 m)   Wt 164 lb (74.4 kg)   SpO2 99%   BMI 27.29 kg/m  Wt Readings from Last 3 Encounters:  11/03/22 164 lb (74.4 kg)  08/03/22 159 lb 1.9 oz (72.2 kg)  07/11/18 161 lb 6.4 oz (73.2 kg)    No results found for: "TSH" Lab Results  Component Value Date   WBC 2.8 (L) 05/26/2017   HGB 12.4 05/26/2017   HCT 35.9 05/26/2017   MCV 89.8 05/26/2017   PLT 285 05/26/2017   Lab Results  Component Value Date   NA 135 05/26/2017   K 3.9  05/26/2017   CO2 24 05/26/2017   GLUCOSE 77 05/26/2017   BUN 7 05/26/2017   CREATININE 0.64 05/26/2017   BILITOT 0.4 05/26/2017   ALKPHOS 58 05/26/2017   AST 18 05/26/2017   ALT 17 05/26/2017   PROT 6.7 05/26/2017   ALBUMIN 4.5 05/26/2017   CALCIUM 9.0 05/26/2017   Lab Results  Component Value Date   CHOL 169 05/26/2017   Lab Results  Component Value Date   HDL 92 05/26/2017   Lab Results  Component Value Date   LDLCALC 66 05/26/2017   Lab Results  Component Value Date   TRIG 56 05/26/2017   Lab Results  Component Value Date   CHOLHDL 1.8 05/26/2017   No results found for: "HGBA1C"    Assessment & Plan:  Essential hypertension Assessment & Plan: Controlled She takes olmesartan hydrochlorothiazide 40-25 mg daily Denies headaches, dizziness, blurred vision, chest pain, palpitation Low-sodium diet with increased physical activity encouraged BP Readings from Last 3 Encounters:  11/03/22 130/82  08/03/22 122/76  07/11/18 122/78     Orders: -     CMP14+EGFR -     CBC with Differential/Platelet  Sleep disturbance Assessment & Plan: Reports difficulty falling asleep and staying asleep She takes Xanax 0.25 mg as needed Reports taking melatonin with minimal relief of her symptoms Will start a trial of hydroxyzine 25 mg nightly as needed Encouraged not to take hydroxyzine and Xanax together  Orders: -     hydrOXYzine Pamoate; Take 1 capsule (25 mg total) by mouth every 8 (eight) hours as needed.  Dispense: 30 capsule; Refill: 1  Rash Assessment & Plan: Erythematous pruritic rash on the lower abdomen and arm She denies changes in her detergents, body wash,and daily routine She reports eating pork and steak, which she typically does not eat No history of atopic dermatitis reported Findings likely of allergic dermatitis Will be treated with topical corticosteroid  Orders: -     Triamcinolone Acetonide; Apply 1 Application topically at bedtime as needed.   Dispense: 15 g; Refill: 0  Panic attacks Assessment & Plan: Controlled She takes Xanax 25 mg as needed   GERD without esophagitis  Hypercholesteremia -     CMP14+EGFR -     Lipid panel  Vitamin D deficiency -     VITAMIN D 25 Hydroxy (Vit-D Deficiency, Fractures)  Impaired fasting blood sugar -  Hemoglobin A1c  Abnormal thyroid screen (blood) -     TSH + free T4  Encounter for hepatitis C screening test for low risk patient -     Hepatitis C antibody  Encounter for screening for HIV -     HIV Antibody (routine testing w rflx)    Follow-up: Return in about 3 months (around 02/01/2023).   Alvira Monday, FNP

## 2022-11-03 NOTE — Assessment & Plan Note (Signed)
Erythematous pruritic rash on the lower abdomen and arm She denies changes in her detergents, body wash,and daily routine She reports eating pork and steak, which she typically does not eat No history of atopic dermatitis reported Findings likely of allergic dermatitis Will be treated with topical corticosteroid

## 2022-11-04 NOTE — Assessment & Plan Note (Addendum)
Controlled She takes olmesartan hydrochlorothiazide 40-25 mg daily Denies headaches, dizziness, blurred vision, chest pain, palpitation Low-sodium diet with increased physical activity encouraged BP Readings from Last 3 Encounters:  11/03/22 130/82  08/03/22 122/76  07/11/18 122/78

## 2022-11-04 NOTE — Assessment & Plan Note (Signed)
Reports difficulty falling asleep and staying asleep She takes Xanax 0.25 mg as needed Reports taking melatonin with minimal relief of her symptoms Will start a trial of hydroxyzine 25 mg nightly as needed Encouraged not to take hydroxyzine and Xanax together

## 2022-11-04 NOTE — Assessment & Plan Note (Signed)
Controlled She takes Xanax 25 mg as needed

## 2022-11-09 ENCOUNTER — Other Ambulatory Visit: Payer: Self-pay

## 2022-11-09 DIAGNOSIS — J302 Other seasonal allergic rhinitis: Secondary | ICD-10-CM

## 2022-11-09 MED ORDER — FLUTICASONE PROPIONATE 50 MCG/ACT NA SUSP: 2.0000 | Freq: Every day | NASAL | 2 refills | Status: DC

## 2022-12-01 DIAGNOSIS — Z1159 Encounter for screening for other viral diseases: Secondary | ICD-10-CM | POA: Diagnosis not present

## 2022-12-01 DIAGNOSIS — E559 Vitamin D deficiency, unspecified: Secondary | ICD-10-CM | POA: Diagnosis not present

## 2022-12-01 DIAGNOSIS — R7989 Other specified abnormal findings of blood chemistry: Secondary | ICD-10-CM | POA: Diagnosis not present

## 2022-12-01 DIAGNOSIS — I10 Essential (primary) hypertension: Secondary | ICD-10-CM | POA: Diagnosis not present

## 2022-12-01 DIAGNOSIS — E7800 Pure hypercholesterolemia, unspecified: Secondary | ICD-10-CM | POA: Diagnosis not present

## 2022-12-01 DIAGNOSIS — R7301 Impaired fasting glucose: Secondary | ICD-10-CM | POA: Diagnosis not present

## 2022-12-02 LAB — CBC WITH DIFFERENTIAL/PLATELET
Basophils Absolute: 0 10*3/uL (ref 0.0–0.2)
Basos: 1 %
EOS (ABSOLUTE): 0 10*3/uL (ref 0.0–0.4)
Eos: 2 %
Hematocrit: 33.2 % — ABNORMAL LOW (ref 34.0–46.6)
Hemoglobin: 11.5 g/dL (ref 11.1–15.9)
Immature Grans (Abs): 0 10*3/uL (ref 0.0–0.1)
Immature Granulocytes: 0 %
Lymphocytes Absolute: 0.8 10*3/uL (ref 0.7–3.1)
Lymphs: 31 %
MCH: 32.3 pg (ref 26.6–33.0)
MCHC: 34.6 g/dL (ref 31.5–35.7)
MCV: 93 fL (ref 79–97)
Monocytes Absolute: 0.3 10*3/uL (ref 0.1–0.9)
Monocytes: 11 %
Neutrophils Absolute: 1.5 10*3/uL (ref 1.4–7.0)
Neutrophils: 55 %
Platelets: 317 10*3/uL (ref 150–450)
RBC: 3.56 x10E6/uL — ABNORMAL LOW (ref 3.77–5.28)
RDW: 12.1 % (ref 11.7–15.4)
WBC: 2.6 10*3/uL — ABNORMAL LOW (ref 3.4–10.8)

## 2022-12-02 LAB — HEPATITIS C ANTIBODY: Hep C Virus Ab: NONREACTIVE

## 2022-12-02 LAB — LIPID PANEL
Chol/HDL Ratio: 2 ratio (ref 0.0–4.4)
Cholesterol, Total: 201 mg/dL — ABNORMAL HIGH (ref 100–199)
HDL: 99 mg/dL (ref >39)
LDL Chol Calc (NIH): 92 mg/dL (ref 0–99)
Triglycerides: 55 mg/dL (ref 0–149)
VLDL Cholesterol Cal: 10 mg/dL (ref 5–40)

## 2022-12-02 LAB — TSH+FREE T4
Free T4: 1.23 ng/dL (ref 0.82–1.77)
TSH: 3.9 u[IU]/mL (ref 0.450–4.500)

## 2022-12-02 LAB — CMP14+EGFR
ALT: 17 IU/L (ref 0–32)
AST: 20 IU/L (ref 0–40)
Albumin/Globulin Ratio: 2.6 — ABNORMAL HIGH (ref 1.2–2.2)
Albumin: 5 g/dL — ABNORMAL HIGH (ref 3.9–4.9)
Alkaline Phosphatase: 78 IU/L (ref 44–121)
BUN/Creatinine Ratio: 14 (ref 9–23)
BUN: 11 mg/dL (ref 6–24)
Bilirubin Total: 0.6 mg/dL (ref 0.0–1.2)
CO2: 22 mmol/L (ref 20–29)
Calcium: 10 mg/dL (ref 8.7–10.2)
Chloride: 93 mmol/L — ABNORMAL LOW (ref 96–106)
Creatinine, Ser: 0.81 mg/dL (ref 0.57–1.00)
Globulin, Total: 1.9 g/dL (ref 1.5–4.5)
Glucose: 100 mg/dL — ABNORMAL HIGH (ref 70–99)
Potassium: 4.2 mmol/L (ref 3.5–5.2)
Sodium: 131 mmol/L — ABNORMAL LOW (ref 134–144)
Total Protein: 6.9 g/dL (ref 6.0–8.5)
eGFR: 88 mL/min/{1.73_m2} (ref >59)

## 2022-12-02 LAB — HEMOGLOBIN A1C
Est. average glucose Bld gHb Est-mCnc: 108 mg/dL
Hgb A1c MFr Bld: 5.4 % (ref 4.8–5.6)

## 2022-12-02 LAB — VITAMIN D 25 HYDROXY (VIT D DEFICIENCY, FRACTURES): Vit D, 25-Hydroxy: 34.5 ng/mL (ref 30.0–100.0)

## 2022-12-02 LAB — HIV ANTIBODY (ROUTINE TESTING W REFLEX): HIV Screen 4th Generation wRfx: NONREACTIVE

## 2022-12-06 NOTE — Progress Notes (Signed)
Please inform the patient that her sodium and chloride levels are slightly low. Table salt is the best source of sodium and chloride. Due to her BP, her daily sodium intake should be less than '1500mg'$ . Her WBC is slightly low, as it was five years ago. I will continue to monitor it. All other labs are stable.

## 2022-12-07 ENCOUNTER — Telehealth: Payer: Self-pay | Admitting: Family Medicine

## 2022-12-07 NOTE — Telephone Encounter (Signed)
Spoke to pt went over labs 

## 2022-12-07 NOTE — Telephone Encounter (Signed)
Patient returning lab result call.  

## 2023-02-01 ENCOUNTER — Ambulatory Visit: Payer: BC Managed Care – PPO | Admitting: Family Medicine

## 2023-02-11 DIAGNOSIS — Z1231 Encounter for screening mammogram for malignant neoplasm of breast: Secondary | ICD-10-CM | POA: Diagnosis not present

## 2023-02-11 DIAGNOSIS — Z6827 Body mass index (BMI) 27.0-27.9, adult: Secondary | ICD-10-CM | POA: Diagnosis not present

## 2023-02-11 DIAGNOSIS — Z124 Encounter for screening for malignant neoplasm of cervix: Secondary | ICD-10-CM | POA: Diagnosis not present

## 2023-02-11 DIAGNOSIS — Z01419 Encounter for gynecological examination (general) (routine) without abnormal findings: Secondary | ICD-10-CM | POA: Diagnosis not present

## 2023-03-01 ENCOUNTER — Encounter: Payer: Self-pay | Admitting: Family Medicine

## 2023-03-01 ENCOUNTER — Ambulatory Visit: Payer: BC Managed Care – PPO | Admitting: Family Medicine

## 2023-03-01 VITALS — BP 102/69 | HR 84 | Wt 162.1 lb

## 2023-03-01 DIAGNOSIS — K219 Gastro-esophageal reflux disease without esophagitis: Secondary | ICD-10-CM

## 2023-03-01 DIAGNOSIS — I10 Essential (primary) hypertension: Secondary | ICD-10-CM | POA: Diagnosis not present

## 2023-03-01 DIAGNOSIS — E559 Vitamin D deficiency, unspecified: Secondary | ICD-10-CM

## 2023-03-01 DIAGNOSIS — E038 Other specified hypothyroidism: Secondary | ICD-10-CM

## 2023-03-01 DIAGNOSIS — R7301 Impaired fasting glucose: Secondary | ICD-10-CM | POA: Diagnosis not present

## 2023-03-01 DIAGNOSIS — G479 Sleep disorder, unspecified: Secondary | ICD-10-CM

## 2023-03-01 DIAGNOSIS — E7849 Other hyperlipidemia: Secondary | ICD-10-CM

## 2023-03-01 NOTE — Progress Notes (Signed)
Established Patient Office Visit  Subjective:  Patient ID: Brittany Winters, female    DOB: 08-06-1971  Age: 52 y.o. MRN: [id number]  CC:  Chief Complaint  Patient presents with   Chronic Care Management    3 month f/u    HPI Brittany Winters is a 52 y.o. female with past medical history of hypertension, GERD and sleep disturbance presents for f/u of  chronic medical conditions. For the details of today's visit, please refer to the assessment and plan.     Past Medical History:  Diagnosis Date   Anxiety    GERD (gastroesophageal reflux disease)    Heart murmur    Hypertension     Past Surgical History:  Procedure Laterality Date   ABLATION  2018   BACK SURGERY  2015   SPINE SURGERY     herniated disc 2015    Family History  Problem Relation Age of Onset   Arthritis Mother    Heart disease Maternal Grandmother    Diabetes Paternal Grandfather     Social History   Socioeconomic History   Marital status: Married    Spouse name: Stacy   Number of children: 0   Years of education: 15   Highest education level: Not on file  Occupational History   Occupation: Production designer, theatre/television/film  Tobacco Use   Smoking status: Former   Smokeless tobacco: Never   Tobacco comments:    a little in Therapist, art Use: Never used  Substance and Sexual Activity   Alcohol use: Yes   Drug use: No   Sexual activity: Yes    Birth control/protection: Surgical  Other Topics Concern   Not on file  Social History Narrative   Lives with [name]   Has "fur babies"   Likes to travel   walks   Social Determinants of Health   Financial Resource Strain: Not on file  Food Insecurity: Not on file  Transportation Needs: Not on file  Physical Activity: Not on file  Stress: Not on file  Social Connections: Not on file  Intimate Partner Violence: Not on file    Outpatient Medications Prior to Visit  Medication Sig Dispense Refill   ALPRAZolam (XANAX) 0.25 MG tablet       Cyanocobalamin (B-12 PO) Take by mouth.     fluticasone (FLONASE) 50 MCG/ACT nasal spray Place 2 sprays into both nostrils daily. 16 g 2   hydrOXYzine (VISTARIL) 25 MG capsule Take 1 capsule (25 mg total) by mouth every 8 (eight) hours as needed. 30 capsule 1   olmesartan-hydrochlorothiazide (BENICAR HCT) 40-25 MG tablet Take 1 tablet by mouth daily.     omeprazole (PRILOSEC) 20 MG capsule Take 1 capsule (20 mg total) by mouth daily. 90 capsule 3   triamcinolone cream (KENALOG) 0.1 % Apply 1 Application topically at bedtime as needed. 15 g 0   VITAMIN E COMPLEX PO Take by mouth.     Zinc Acetate, Oral, (ZINC ACETATE PO) Take by mouth.     No facility-administered medications prior to visit.    No Known Allergies  ROS Review of Systems    Objective:    Physical Exam  BP 102/69   Pulse 84   Wt 162 lb 1.9 oz (73.5 kg)   SpO2 98%   BMI 26.98 kg/m  Wt Readings from Last 3 Encounters:  03/01/23 162 lb 1.9 oz (73.5 kg)  11/03/22 164 lb (74.4 kg)  08/03/22 159 lb 1.9 oz (72.2  kg)    Lab Results  Component Value Date   TSH 3.900 12/01/2022   Lab Results  Component Value Date   WBC 2.6 (L) 12/01/2022   HGB 11.5 12/01/2022   HCT 33.2 (L) 12/01/2022   MCV 93 12/01/2022   PLT 317 12/01/2022   Lab Results  Component Value Date   NA 131 (L) 12/01/2022   K 4.2 12/01/2022   CO2 22 12/01/2022   GLUCOSE 100 (H) 12/01/2022   BUN 11 12/01/2022   CREATININE 0.81 12/01/2022   BILITOT 0.6 12/01/2022   ALKPHOS 78 12/01/2022   AST 20 12/01/2022   ALT 17 12/01/2022   PROT 6.9 12/01/2022   ALBUMIN 5.0 (H) 12/01/2022   CALCIUM 10.0 12/01/2022   EGFR 88 12/01/2022   Lab Results  Component Value Date   CHOL 201 (H) 12/01/2022   Lab Results  Component Value Date   HDL 99 12/01/2022   Lab Results  Component Value Date   LDLCALC 92 12/01/2022   Lab Results  Component Value Date   TRIG 55 12/01/2022   Lab Results  Component Value Date   CHOLHDL 2.0 12/01/2022   Lab  Results  Component Value Date   HGBA1C 5.4 12/01/2022      Assessment & Plan:  Essential hypertension Assessment & Plan: Controlled She takes olmesartan hydrochlorothiazide 40-25 mg daily Denies headaches, dizziness, blurred vision, chest pain, palpitation Encouraged to continue treatment regimen BP Readings from Last 3 Encounters:  03/01/23 102/69  11/03/22 130/82  08/03/22 122/76      GERD without esophagitis Assessment & Plan: Encouraged to take omeprazole 20 mg as needed for symptoms of GERD No complaints or concerns voiced    Sleep disturbance Assessment & Plan: Reported not starting hydroxyzine 25 mg nightly as needed Encouraged to take medication as needed for sleep disturbance   IFG (impaired fasting glucose) -     Hemoglobin A1c  Vitamin D deficiency -     VITAMIN D 25 Hydroxy (Vit-D Deficiency, Fractures)  Other specified hypothyroidism -     TSH + free T4  Other hyperlipidemia -     Lipid panel -     CMP14+EGFR -     CBC with Differential/Platelet    Follow-up: Return in about 3 months (around 06/01/2023).   Gilmore Laroche, FNP

## 2023-03-01 NOTE — Patient Instructions (Signed)
I appreciate the opportunity to provide care to you today!    Follow up:  3 months  Labs: please stop by the lab in a month to get your blood drawn (CBC, CMP, TSH, Lipid profile, HgA1c, Vit D)     Please continue to a heart-healthy diet and increase your physical activities. Try to exercise for at least five days a week.      It was a pleasure to see you and I look forward to continuing to work together on your health and well-being. Please do not hesitate to call the office if you need care or have questions about your care.   Have a wonderful day and week. With Gratitude, Gilmore Laroche MSN, FNP-BC

## 2023-03-01 NOTE — Assessment & Plan Note (Signed)
Controlled She takes olmesartan hydrochlorothiazide 40-25 mg daily Denies headaches, dizziness, blurred vision, chest pain, palpitation Encouraged to continue treatment regimen BP Readings from Last 3 Encounters:  03/01/23 102/69  11/03/22 130/82  08/03/22 122/76

## 2023-03-01 NOTE — Assessment & Plan Note (Addendum)
Encouraged to take omeprazole 20 mg as needed for symptoms of GERD No complaints or concerns voiced

## 2023-03-01 NOTE — Assessment & Plan Note (Signed)
Reported not starting hydroxyzine 25 mg nightly as needed Encouraged to take medication as needed for sleep disturbance

## 2023-03-07 ENCOUNTER — Other Ambulatory Visit: Payer: Self-pay | Admitting: Family Medicine

## 2023-03-07 DIAGNOSIS — J302 Other seasonal allergic rhinitis: Secondary | ICD-10-CM

## 2023-06-07 ENCOUNTER — Ambulatory Visit: Payer: BC Managed Care – PPO | Admitting: Family Medicine

## 2023-06-16 DIAGNOSIS — R7301 Impaired fasting glucose: Secondary | ICD-10-CM | POA: Diagnosis not present

## 2023-06-16 DIAGNOSIS — E559 Vitamin D deficiency, unspecified: Secondary | ICD-10-CM | POA: Diagnosis not present

## 2023-06-16 DIAGNOSIS — E038 Other specified hypothyroidism: Secondary | ICD-10-CM | POA: Diagnosis not present

## 2023-06-16 DIAGNOSIS — E7849 Other hyperlipidemia: Secondary | ICD-10-CM | POA: Diagnosis not present

## 2023-06-17 LAB — CBC WITH DIFFERENTIAL/PLATELET
Basophils Absolute: 0 10*3/uL (ref 0.0–0.2)
Basos: 1 %
EOS (ABSOLUTE): 0.1 10*3/uL (ref 0.0–0.4)
Eos: 2 %
Hematocrit: 39.4 % (ref 34.0–46.6)
Hemoglobin: 13.5 g/dL (ref 11.1–15.9)
Immature Grans (Abs): 0 10*3/uL (ref 0.0–0.1)
Immature Granulocytes: 0 %
Lymphocytes Absolute: 0.7 10*3/uL (ref 0.7–3.1)
Lymphs: 24 %
MCH: 32.6 pg (ref 26.6–33.0)
MCHC: 34.3 g/dL (ref 31.5–35.7)
MCV: 95 fL (ref 79–97)
Monocytes Absolute: 0.5 10*3/uL (ref 0.1–0.9)
Monocytes: 16 %
Neutrophils Absolute: 1.7 10*3/uL (ref 1.4–7.0)
Neutrophils: 57 %
Platelets: 330 10*3/uL (ref 150–450)
RBC: 4.14 x10E6/uL (ref 3.77–5.28)
RDW: 12.7 % (ref 11.7–15.4)
WBC: 3 10*3/uL — ABNORMAL LOW (ref 3.4–10.8)

## 2023-06-17 LAB — LIPID PANEL
Chol/HDL Ratio: 1.9 ratio (ref 0.0–4.4)
Cholesterol, Total: 202 mg/dL — ABNORMAL HIGH (ref 100–199)
HDL: 105 mg/dL (ref >39)
LDL Chol Calc (NIH): 84 mg/dL (ref 0–99)
Triglycerides: 70 mg/dL (ref 0–149)
VLDL Cholesterol Cal: 13 mg/dL (ref 5–40)

## 2023-06-17 LAB — CMP14+EGFR
ALT: 21 IU/L (ref 0–32)
AST: 25 IU/L (ref 0–40)
Albumin: 4.9 g/dL (ref 3.8–4.9)
Alkaline Phosphatase: 80 IU/L (ref 44–121)
BUN/Creatinine Ratio: 9 (ref 9–23)
BUN: 7 mg/dL (ref 6–24)
Bilirubin Total: 1 mg/dL (ref 0.0–1.2)
CO2: 26 mmol/L (ref 20–29)
Calcium: 10.3 mg/dL — ABNORMAL HIGH (ref 8.7–10.2)
Chloride: 92 mmol/L — ABNORMAL LOW (ref 96–106)
Creatinine, Ser: 0.75 mg/dL (ref 0.57–1.00)
Globulin, Total: 2.1 g/dL (ref 1.5–4.5)
Glucose: 99 mg/dL (ref 70–99)
Potassium: 3.8 mmol/L (ref 3.5–5.2)
Sodium: 133 mmol/L — ABNORMAL LOW (ref 134–144)
Total Protein: 7 g/dL (ref 6.0–8.5)
eGFR: 96 mL/min/{1.73_m2} (ref >59)

## 2023-06-17 LAB — HEMOGLOBIN A1C
Est. average glucose Bld gHb Est-mCnc: 103 mg/dL
Hgb A1c MFr Bld: 5.2 % (ref 4.8–5.6)

## 2023-06-17 LAB — VITAMIN D 25 HYDROXY (VIT D DEFICIENCY, FRACTURES): Vit D, 25-Hydroxy: 52.5 ng/mL (ref 30.0–100.0)

## 2023-06-17 LAB — TSH+FREE T4
Free T4: 1.23 ng/dL (ref 0.82–1.77)
TSH: 2.84 u[IU]/mL (ref 0.450–4.500)

## 2023-06-23 ENCOUNTER — Ambulatory Visit: Payer: BC Managed Care – PPO | Admitting: Family Medicine

## 2023-06-23 VITALS — BP 164/104 | HR 78 | Ht 65.0 in | Wt 163.0 lb

## 2023-06-23 DIAGNOSIS — J302 Other seasonal allergic rhinitis: Secondary | ICD-10-CM

## 2023-06-23 DIAGNOSIS — K219 Gastro-esophageal reflux disease without esophagitis: Secondary | ICD-10-CM | POA: Diagnosis not present

## 2023-06-23 DIAGNOSIS — I10 Essential (primary) hypertension: Secondary | ICD-10-CM

## 2023-06-23 MED ORDER — OLMESARTAN MEDOXOMIL-HCTZ 40-25 MG PO TABS
1.0000 | ORAL_TABLET | Freq: Every day | ORAL | 1 refills | Status: DC
Start: 2023-06-23 — End: 2023-09-16

## 2023-06-23 MED ORDER — FLUTICASONE PROPIONATE 50 MCG/ACT NA SUSP: NASAL | 2 refills | Status: DC

## 2023-06-23 MED ORDER — OMEPRAZOLE 20 MG PO CPDR: 20.0000 mg | DELAYED_RELEASE_CAPSULE | ORAL | 3 refills | Status: DC | PRN

## 2023-06-23 NOTE — Progress Notes (Signed)
Established Patient Office Visit  Subjective:  Patient ID: Brittany Winters, female    DOB: [birth year]  Age: 52 y.o. MRN: [id number]  CC:  Chief Complaint  Patient presents with   Follow-up    HPI Brittany Winters is a 52 y.o. female with past medical history of hypertension, GERD and seasonal allergies presents for f/u of  chronic medical conditions.  Hypertension: She is prescribed olmesartan-hydrochlorothiazide 40-25 mg daily but reports only taking hydrochlorothiazide 25 mg daily due to running out of her medication. She is currently asymptomatic in the clinic but mentions experiencing increased stress and inadequate sleep.  GERD: The patient requests a refill of omeprazole 20 mg.  Seasonal Allergies: She also requests a refill of Flonase for her nasal congestion.  Past Medical History:  Diagnosis Date   Anxiety    GERD (gastroesophageal reflux disease)    Heart murmur    Hypertension     Past Surgical History:  Procedure Laterality Date   ABLATION  2018   BACK SURGERY  2015   SPINE SURGERY     herniated disc 2015    Family History  Problem Relation Age of Onset   Arthritis Mother    Heart disease Maternal Grandmother    Diabetes Paternal Grandfather     Social History   Socioeconomic History   Marital status: Divorced    Spouse name: Stacy   Number of children: 0   Years of education: 15   Highest education level: Not on file  Occupational History   Occupation: Production designer, theatre/television/film  Tobacco Use   Smoking status: Former   Smokeless tobacco: Never   Tobacco comments:    a little in Designer, multimedia   Vaping status: Never Used  Substance and Sexual Activity   Alcohol use: Yes   Drug use: No   Sexual activity: Yes    Birth control/protection: Surgical  Other Topics Concern   Not on file  Social History Narrative   Lives with [name]   Has "fur babies"   Likes to travel   walks   Social Determinants of Health   Financial Resource Strain: Not on file   Food Insecurity: Not on file  Transportation Needs: Not on file  Physical Activity: Not on file  Stress: Not on file  Social Connections: Not on file  Intimate Partner Violence: Not on file    Outpatient Medications Prior to Visit  Medication Sig Dispense Refill   ALPRAZolam (XANAX) 0.25 MG tablet      Cyanocobalamin (B-12 PO) Take by mouth.     hydrochlorothiazide (HYDRODIURIL) 25 MG tablet Take 25 mg by mouth daily.     VITAMIN E COMPLEX PO Take by mouth.     fluticasone (FLONASE) 50 MCG/ACT nasal spray SHAKE LIQUID AND USE 2 SPRAYS IN EACH NOSTRIL DAILY 16 g 2   omeprazole (PRILOSEC) 20 MG capsule Take 1 capsule (20 mg total) by mouth daily. 90 capsule 3   hydrOXYzine (VISTARIL) 25 MG capsule Take 1 capsule (25 mg total) by mouth every 8 (eight) hours as needed. (Patient not taking: Reported on 06/23/2023) 30 capsule 1   olmesartan-hydrochlorothiazide (BENICAR HCT) 40-25 MG tablet Take 1 tablet by mouth daily. (Patient not taking: Reported on 06/23/2023)     triamcinolone cream (KENALOG) 0.1 % Apply 1 Application topically at bedtime as needed. 15 g 0   Zinc Acetate, Oral, (ZINC ACETATE PO) Take by mouth.     No facility-administered medications prior to visit.  No Known Allergies  ROS Review of Systems  Constitutional:  Negative for chills and fever.  Eyes:  Negative for visual disturbance.  Respiratory:  Negative for chest tightness and shortness of breath.   Neurological:  Negative for dizziness and headaches.      Objective:    Physical Exam HENT:     Head: Normocephalic.     Mouth/Throat:     Mouth: Mucous membranes are moist.  Cardiovascular:     Rate and Rhythm: Normal rate.     Heart sounds: Normal heart sounds.  Pulmonary:     Effort: Pulmonary effort is normal.     Breath sounds: Normal breath sounds.  Neurological:     Mental Status: She is alert.     BP (!) 164/104   Pulse 78   Ht 5\' 5"  (1.651 m)   Wt 163 lb (73.9 kg)   SpO2 98%   BMI 27.12  kg/m  Wt Readings from Last 3 Encounters:  06/23/23 163 lb (73.9 kg)  03/01/23 162 lb 1.9 oz (73.5 kg)  11/03/22 164 lb (74.4 kg)    Lab Results  Component Value Date   TSH 2.840 06/16/2023   Lab Results  Component Value Date   WBC 3.0 (L) 06/16/2023   HGB 13.5 06/16/2023   HCT 39.4 06/16/2023   MCV 95 06/16/2023   PLT 330 06/16/2023   Lab Results  Component Value Date   NA 133 (L) 06/16/2023   K 3.8 06/16/2023   CO2 26 06/16/2023   GLUCOSE 99 06/16/2023   BUN 7 06/16/2023   CREATININE 0.75 06/16/2023   BILITOT 1.0 06/16/2023   ALKPHOS 80 06/16/2023   AST 25 06/16/2023   ALT 21 06/16/2023   PROT 7.0 06/16/2023   ALBUMIN 4.9 06/16/2023   CALCIUM 10.3 (H) 06/16/2023   EGFR 96 06/16/2023   Lab Results  Component Value Date   CHOL 202 (H) 06/16/2023   Lab Results  Component Value Date   HDL 105 06/16/2023   Lab Results  Component Value Date   LDLCALC 84 06/16/2023   Lab Results  Component Value Date   TRIG 70 06/16/2023   Lab Results  Component Value Date   CHOLHDL 1.9 06/16/2023   Lab Results  Component Value Date   HGBA1C 5.2 06/16/2023      Assessment & Plan:  Essential hypertension Assessment & Plan: Uncontrolled, likely due to increased stress, inadequate sleep, and non-adherence to the prescribed antihypertensive regimen. Hypertension Management  Your current blood pressure is above the target goal of <140/90 mmHg. To address this, please take olmesartan-hydrochlorothiazide 40-20mg  daily   Medication Instructions: Take your blood pressure medication at the same time each day. After taking your medication, check your blood pressure at least an hour later. If your first reading is >140/90 mmHg, wait at least 10 minutes and recheck your blood pressure. Side Effects: In the initial days of therapy, you may experience dizziness or lightheadedness as your body adjusts to the lower blood pressure; this is expected. Diet and Lifestyle: Adhere to a  low-sodium diet, limiting intake to less than 1500 mg daily, and increase your physical activity. Avoid over-the-counter NSAIDs such as ibuprofen and naproxen while on this medication. Hydration and Nutrition: Stay well-hydrated by drinking at least 64 ounces of water daily. Increase your servings of fruits and vegetables and avoid excessive sodium in your diet. Long-Term Considerations: Uncontrolled hypertension can increase the risk of cardiovascular diseases, including stroke, coronary artery disease, and heart failure.  Please report to the emergency department if your blood pressure exceeds 180/120 and is accompanied by symptoms such as headaches, chest pain, palpitations, blurred vision, or dizziness.   Orders: -     Olmesartan Medoxomil-HCTZ; Take 1 tablet by mouth daily.  Dispense: 60 tablet; Refill: 1  Seasonal allergies Assessment & Plan: Refilled Flonase  Orders: -     Fluticasone Propionate; SHAKE LIQUID AND USE 2 SPRAYS IN EACH NOSTRIL DAILY  Dispense: 16 g; Refill: 2  GERD without esophagitis Assessment & Plan: For managing GERD, I recommend the following lifestyle changes:  Avoid Certain Foods and Drinks: Limit or eliminate coffee, chocolate, onions, peppermint, spicy foods, carbonated beverages, citrus fruits, tomatoes, garlic, alcohol, and fatty foods such as bacon, burgers, sausages, steak, fried foods, and dairy products.  Recommended Foods: Increase your intake of high-fiber foods including whole grain cereals, oatmeal, brown rice, root vegetables, and non-citrus fruits. Opt for high-protein foods and healthy fats such as avocados, olive oil, nuts, and seeds.   Orders: -     Omeprazole; Take 1 capsule (20 mg total) by mouth as needed.  Dispense: 90 capsule; Refill: 3  Note: This chart has been completed using Engineer, civil (consulting) software, and while attempts have been made to ensure accuracy, certain words and phrases may not be transcribed as intended.     Follow-up: Return in about 1 month (around 07/23/2023) for BP.   Gilmore Laroche, FNP

## 2023-06-23 NOTE — Assessment & Plan Note (Signed)
Refilled Flonase

## 2023-06-23 NOTE — Assessment & Plan Note (Signed)
Uncontrolled, likely due to increased stress, inadequate sleep, and non-adherence to the prescribed antihypertensive regimen. Hypertension Management  Your current blood pressure is above the target goal of <140/90 mmHg. To address this, please take olmesartan-hydrochlorothiazide 40-20mg  daily   Medication Instructions: Take your blood pressure medication at the same time each day. After taking your medication, check your blood pressure at least an hour later. If your first reading is >140/90 mmHg, wait at least 10 minutes and recheck your blood pressure. Side Effects: In the initial days of therapy, you may experience dizziness or lightheadedness as your body adjusts to the lower blood pressure; this is expected. Diet and Lifestyle: Adhere to a low-sodium diet, limiting intake to less than 1500 mg daily, and increase your physical activity. Avoid over-the-counter NSAIDs such as ibuprofen and naproxen while on this medication. Hydration and Nutrition: Stay well-hydrated by drinking at least 64 ounces of water daily. Increase your servings of fruits and vegetables and avoid excessive sodium in your diet. Long-Term Considerations: Uncontrolled hypertension can increase the risk of cardiovascular diseases, including stroke, coronary artery disease, and heart failure.  Please report to the emergency department if your blood pressure exceeds 180/120 and is accompanied by symptoms such as headaches, chest pain, palpitations, blurred vision, or dizziness.

## 2023-06-23 NOTE — Assessment & Plan Note (Signed)
For managing GERD, I recommend the following lifestyle changes:  Avoid Certain Foods and Drinks: Limit or eliminate coffee, chocolate, onions, peppermint, spicy foods, carbonated beverages, citrus fruits, tomatoes, garlic, alcohol, and fatty foods such as bacon, burgers, sausages, steak, fried foods, and dairy products.  Recommended Foods: Increase your intake of high-fiber foods including whole grain cereals, oatmeal, brown rice, root vegetables, and non-citrus fruits. Opt for high-protein foods and healthy fats such as avocados, olive oil, nuts, and seeds.

## 2023-06-23 NOTE — Patient Instructions (Addendum)
I appreciate the opportunity to provide care to you today!    Follow up:  1 month for BP  Hypertension Management  Your current blood pressure is above the target goal of <140/90 mmHg. To address this, please take olmesartan-hydrochlorothiazide 40-20mg  daily   Medication Instructions: Take your blood pressure medication at the same time each day. After taking your medication, check your blood pressure at least an hour later. If your first reading is >140/90 mmHg, wait at least 10 minutes and recheck your blood pressure. Side Effects: In the initial days of therapy, you may experience dizziness or lightheadedness as your body adjusts to the lower blood pressure; this is expected. Diet and Lifestyle: Adhere to a low-sodium diet, limiting intake to less than 1500 mg daily, and increase your physical activity. Avoid over-the-counter NSAIDs such as ibuprofen and naproxen while on this medication. Hydration and Nutrition: Stay well-hydrated by drinking at least 64 ounces of water daily. Increase your servings of fruits and vegetables and avoid excessive sodium in your diet. Long-Term Considerations: Uncontrolled hypertension can increase the risk of cardiovascular diseases, including stroke, coronary artery disease, and heart failure.  Please report to the emergency department if your blood pressure exceeds 180/120 and is accompanied by symptoms such as headaches, chest pain, palpitations, blurred vision, or dizziness.    For managing GERD, I recommend the following lifestyle changes:  Avoid Certain Foods and Drinks: Limit or eliminate coffee, chocolate, onions, peppermint, spicy foods, carbonated beverages, citrus fruits, tomatoes, garlic, alcohol, and fatty foods such as bacon, burgers, sausages, steak, fried foods, and dairy products.  Recommended Foods: Increase your intake of high-fiber foods including whole grain cereals, oatmeal, brown rice, root vegetables, and non-citrus fruits. Opt for  high-protein foods and healthy fats such as avocados, olive oil, nuts, and seeds.     Attached with your AVS, you will find valuable resources for self-education. I highly recommend dedicating some time to thoroughly examine them.   Please continue to a heart-healthy diet and increase your physical activities. Try to exercise for at least five days a week.    It was a pleasure to see you and I look forward to continuing to work together on your health and well-being. Please do not hesitate to call the office if you need care or have questions about your care.  In case of emergency, please visit the Emergency Department for urgent care, or contact our clinic at [phone] to schedule an appointment. We're here to help you!   Have a wonderful day and week. With Gratitude, Gilmore Laroche MSN, FNP-BC

## 2023-07-23 ENCOUNTER — Ambulatory Visit: Payer: BC Managed Care – PPO | Admitting: Family Medicine

## 2023-07-26 ENCOUNTER — Ambulatory Visit: Payer: BC Managed Care – PPO | Admitting: Family Medicine

## 2023-07-26 ENCOUNTER — Encounter: Payer: Self-pay | Admitting: Family Medicine

## 2023-07-26 VITALS — BP 116/80 | HR 88 | Resp 16 | Ht 65.5 in | Wt 161.0 lb

## 2023-07-26 DIAGNOSIS — I10 Essential (primary) hypertension: Secondary | ICD-10-CM | POA: Diagnosis not present

## 2023-07-26 DIAGNOSIS — Z23 Encounter for immunization: Secondary | ICD-10-CM | POA: Diagnosis not present

## 2023-07-26 HISTORY — DX: Encounter for immunization: Z23

## 2023-07-26 NOTE — Patient Instructions (Addendum)
I appreciate the opportunity to provide care to you today!    Follow up:  4 months   Attached with your AVS, you will find valuable resources for self-education. I highly recommend dedicating some time to thoroughly examine them.   Please continue to a heart-healthy diet and increase your physical activities. Try to exercise for at least five days a week.    It was a pleasure to see you and I look forward to continuing to work together on your health and well-being. Please do not hesitate to call the office if you need care or have questions about your care.  In case of emergency, please visit the Emergency Department for urgent care, or contact our clinic at [phone] to schedule an appointment. We're here to help you!   Have a wonderful day and week. With Gratitude, Gilmore Laroche MSN, FNP-BC

## 2023-07-26 NOTE — Progress Notes (Signed)
Established Patient Office Visit  Subjective:  Patient ID: Brittany Winters, female    DOB: Nov 26, 1970  Age: 52 y.o. MRN: [id number]  CC:  Chief Complaint  Patient presents with   Hypertension    1 month follow up     HPI Brittany Winters is a 52 y.o. female presents for blood pressure f/u.  Hypertension: Stable on olmesartan-hydrochlorothiazide 40-25 mg daily.  Asymptomatic in the clinic and she reports treatment compliance  Past Medical History:  Diagnosis Date   Anxiety    Encounter for immunization 07/26/2023   GERD (gastroesophageal reflux disease)    Heart murmur    Hypertension     Past Surgical History:  Procedure Laterality Date   ABLATION  2018   BACK SURGERY  2015   SPINE SURGERY     herniated disc 2015    Family History  Problem Relation Age of Onset   Arthritis Mother    Heart disease Maternal Grandmother    Diabetes Paternal Grandfather     Social History   Socioeconomic History   Marital status: Divorced    Spouse name: Stacy   Number of children: 0   Years of education: 15   Highest education level: Not on file  Occupational History   Occupation: Production designer, theatre/television/film  Tobacco Use   Smoking status: Former   Smokeless tobacco: Never   Tobacco comments:    a little in Designer, multimedia   Vaping status: Never Used  Substance and Sexual Activity   Alcohol use: Yes   Drug use: No   Sexual activity: Yes    Birth control/protection: Surgical  Other Topics Concern   Not on file  Social History Narrative   Lives with Brittany Winters   Has "fur babies"   Likes to travel   walks   Social Determinants of Health   Financial Resource Strain: Not on file  Food Insecurity: Not on file  Transportation Needs: Not on file  Physical Activity: Not on file  Stress: Not on file  Social Connections: Not on file  Intimate Partner Violence: Not on file    Outpatient Medications Prior to Visit  Medication Sig Dispense Refill   ALPRAZolam (XANAX) 0.25 MG tablet       Cyanocobalamin (B-12 PO) Take by mouth.     fluticasone (FLONASE) 50 MCG/ACT nasal spray SHAKE LIQUID AND USE 2 SPRAYS IN EACH NOSTRIL DAILY 16 g 2   hydrOXYzine (VISTARIL) 25 MG capsule Take 1 capsule (25 mg total) by mouth every 8 (eight) hours as needed. 30 capsule 1   olmesartan-hydrochlorothiazide (BENICAR HCT) 40-25 MG tablet Take 1 tablet by mouth daily. 60 tablet 1   omeprazole (PRILOSEC) 20 MG capsule Take 1 capsule (20 mg total) by mouth as needed. 90 capsule 3   VITAMIN E COMPLEX PO Take by mouth.     hydrochlorothiazide (HYDRODIURIL) 25 MG tablet Take 25 mg by mouth daily.     No facility-administered medications prior to visit.    No Known Allergies  ROS Review of Systems  Constitutional:  Negative for chills and fever.  Eyes:  Negative for visual disturbance.  Respiratory:  Negative for chest tightness and shortness of breath.   Neurological:  Negative for dizziness and headaches.      Objective:    Physical Exam HENT:     Head: Normocephalic.     Mouth/Throat:     Mouth: Mucous membranes are moist.  Cardiovascular:     Rate and Rhythm: Normal rate.  Heart sounds: Normal heart sounds.  Pulmonary:     Effort: Pulmonary effort is normal.     Breath sounds: Normal breath sounds.  Neurological:     Mental Status: She is alert.     BP 116/80   Pulse 88   Resp 16   Ht 5' 5.5" (1.664 m)   Wt 161 lb (73 kg)   SpO2 98%   BMI 26.38 kg/m  Wt Readings from Last 3 Encounters:  07/26/23 161 lb (73 kg)  06/23/23 163 lb (73.9 kg)  03/01/23 162 lb 1.9 oz (73.5 kg)    Lab Results  Component Value Date   TSH 2.840 06/16/2023   Lab Results  Component Value Date   WBC 3.0 (L) 06/16/2023   HGB 13.5 06/16/2023   HCT 39.4 06/16/2023   MCV 95 06/16/2023   PLT 330 06/16/2023   Lab Results  Component Value Date   NA 133 (L) 06/16/2023   K 3.8 06/16/2023   CO2 26 06/16/2023   GLUCOSE 99 06/16/2023   BUN 7 06/16/2023   CREATININE 0.75 06/16/2023    BILITOT 1.0 06/16/2023   ALKPHOS 80 06/16/2023   AST 25 06/16/2023   ALT 21 06/16/2023   PROT 7.0 06/16/2023   ALBUMIN 4.9 06/16/2023   CALCIUM 10.3 (H) 06/16/2023   EGFR 96 06/16/2023   Lab Results  Component Value Date   CHOL 202 (H) 06/16/2023   Lab Results  Component Value Date   HDL 105 06/16/2023   Lab Results  Component Value Date   LDLCALC 84 06/16/2023   Lab Results  Component Value Date   TRIG 70 06/16/2023   Lab Results  Component Value Date   CHOLHDL 1.9 06/16/2023   Lab Results  Component Value Date   HGBA1C 5.2 06/16/2023      Assessment & Plan:  Essential hypertension Assessment & Plan: Controlled Encouraged to continue taking olmesartan-hydrochlorothiazide 40-25 mg daily Low-sodium diet with increased physical activity encouraged BP Readings from Last 3 Encounters:  07/26/23 116/80  06/23/23 (!) 164/104  03/01/23 102/69       Encounter for immunization Assessment & Plan: Patient educated on CDC recommendation for the vaccine. Verbal consent was obtained from the patient, vaccine administered by nurse, no sign of adverse reactions noted at this time. Patient education on arm soreness and use of tylenol or ibuprofen for this patient  was discussed. Patient educated on the signs and symptoms of adverse effect and advise to contact the office if they occur.   Orders: -     Flu vaccine trivalent PF, 6mos and older(Flulaval,Afluria,Fluarix,Fluzone)  Note: This chart has been completed using Engineer, civil (consulting) software, and while attempts have been made to ensure accuracy, certain words and phrases may not be transcribed as intended.    Follow-up: No follow-ups on file.   Gilmore Laroche, FNP

## 2023-07-26 NOTE — Assessment & Plan Note (Signed)
Patient educated on CDC recommendation for the vaccine. Verbal consent was obtained from the patient, vaccine administered by nurse, no sign of adverse reactions noted at this time. Patient education on arm soreness and use of tylenol or ibuprofen for this patient  was discussed. Patient educated on the signs and symptoms of adverse effect and advise to contact the office if they occur.  

## 2023-07-26 NOTE — Assessment & Plan Note (Signed)
Controlled Encouraged to continue taking olmesartan-hydrochlorothiazide 40-25 mg daily Low-sodium diet with increased physical activity encouraged BP Readings from Last 3 Encounters:  07/26/23 116/80  06/23/23 (!) 164/104  03/01/23 102/69

## 2023-09-16 ENCOUNTER — Other Ambulatory Visit: Payer: Self-pay | Admitting: Family Medicine

## 2023-09-16 DIAGNOSIS — I10 Essential (primary) hypertension: Secondary | ICD-10-CM

## 2023-09-17 MED ORDER — OLMESARTAN MEDOXOMIL-HCTZ 40-25 MG PO TABS
1.0000 | ORAL_TABLET | Freq: Every day | ORAL | 1 refills | Status: DC
Start: 2023-09-17 — End: 2023-12-09

## 2023-09-17 NOTE — Telephone Encounter (Signed)
Copied from CRM [id number]. Topic: Clinical - Medication Refill >> Sep 16, 2023  3:11 PM Elle L wrote: Most Recent Primary Care Visit:  Provider: Gilmore Laroche  Department: [facility]  Visit Type: OFFICE VISIT  Date: 07/26/2023  Medication: olmesartan-hydrochlorothiazide (BENICAR HCT) 40-25 MG tablet  Has the patient contacted their pharmacy? Yes  Is this the correct pharmacy for this prescription? Yes  This is the patient's preferred pharmacy:  [id number] - [place], [place] - [address]  [place] [address] Phone: [phone] Fax: [phone]   Has the prescription been filled recently? Yes  Is the patient out of the medication? Yes  Has the patient been seen for an appointment in the last year OR does the patient have an upcoming appointment? Yes  Can we respond through MyChart? Yes  Agent: Please be advised that Rx refills may take up to 3 business days. We ask that you follow-up with your pharmacy.

## 2023-10-25 ENCOUNTER — Telehealth: Payer: Self-pay

## 2023-10-25 NOTE — Telephone Encounter (Signed)
Pt is schedule for 11/23/23

## 2023-10-25 NOTE — Telephone Encounter (Signed)
Copied from CRM [id number]. Topic: General - Other >> Oct 25, 2023 11:15 AM Brittany Winters wrote: Reason for CRM: Patient called in regards to missing a phone call about her apt in feb. Patient has requested a call back she was unsure of what the message was about

## 2023-11-11 ENCOUNTER — Telehealth: Payer: BC Managed Care – PPO | Admitting: Physician Assistant

## 2023-11-11 DIAGNOSIS — J019 Acute sinusitis, unspecified: Secondary | ICD-10-CM | POA: Diagnosis not present

## 2023-11-11 DIAGNOSIS — B9789 Other viral agents as the cause of diseases classified elsewhere: Secondary | ICD-10-CM | POA: Diagnosis not present

## 2023-11-11 MED ORDER — PREDNISONE 10 MG (21) PO TBPK
ORAL_TABLET | ORAL | 0 refills | Status: DC
Start: 2023-11-11 — End: 2023-12-09

## 2023-11-11 NOTE — Progress Notes (Signed)
I have spent 5 minutes in review of e-visit questionnaire, review and updating patient chart, medical decision making and response to patient.   Piedad Climes, PA-C

## 2023-11-11 NOTE — Progress Notes (Signed)
E-Visit for Sinus Problems  We are sorry that you are not feeling well.  Here is how we plan to help!  Based on what you have shared with me it looks like you have sinusitis.  Sinusitis is inflammation and infection in the sinus cavities of the head.  Based on your presentation I believe you most likely have Acute Viral Sinusitis.This is an infection most likely caused by a virus. There is not specific treatment for viral sinusitis other than to help you with the symptoms until the infection runs its course.  You may use an oral decongestant such as Mucinex D or if you have glaucoma or high blood pressure use plain Mucinex. Saline nasal spray help and can safely be used as often as needed for congestion. Continue your nasal steroid spray.  I am adding on a steroid pack to reduce sinus inflammation and help expedite recovery.  Some authorities believe that zinc sprays or the use of Echinacea may shorten the course of your symptoms.  Sinus infections are not as easily transmitted as other respiratory infection, however we still recommend that you avoid close contact with loved ones, especially the very young and elderly.  Remember to wash your hands thoroughly throughout the day as this is the number one way to prevent the spread of infection!  Home Care: Only take medications as instructed by your medical team. Do not take these medications with alcohol. A steam or ultrasonic humidifier can help congestion.  You can place a towel over your head and breathe in the steam from hot water coming from a faucet. Avoid close contacts especially the very young and the elderly. Cover your mouth when you cough or sneeze. Always remember to wash your hands.  Get Help Right Away If: You develop worsening fever or sinus pain. You develop a severe head ache or visual changes. Your symptoms persist after you have completed your treatment plan.  Make sure you Understand these instructions. Will watch your  condition. Will get help right away if you are not doing well or get worse.   Thank you for choosing an e-visit.  Your e-visit answers were reviewed by a board certified advanced clinical practitioner to complete your personal care plan. Depending upon the condition, your plan could have included both over the counter or prescription medications.  Please review your pharmacy choice. Make sure the pharmacy is open so you can pick up prescription now. If there is a problem, you may contact your provider through [employer] and have the prescription routed to another pharmacy.  Your safety is important to [place]. If you have drug allergies check your prescription carefully.   For the next 24 hours you can use MyChart to ask questions about today's visit, request a non-urgent call back, or ask for a work or school excuse. You will get an email in the next two days asking about your experience. I hope that your e-visit has been valuable and will speed your recovery.

## 2023-11-23 ENCOUNTER — Ambulatory Visit: Payer: BC Managed Care – PPO | Admitting: Family Medicine

## 2023-12-01 ENCOUNTER — Ambulatory Visit: Payer: BC Managed Care – PPO | Admitting: Family Medicine

## 2023-12-09 ENCOUNTER — Encounter: Payer: Self-pay | Admitting: Family Medicine

## 2023-12-09 ENCOUNTER — Ambulatory Visit: Payer: BC Managed Care – PPO | Admitting: Family Medicine

## 2023-12-09 VITALS — BP 117/75 | HR 85 | Ht 65.5 in | Wt 166.1 lb

## 2023-12-09 DIAGNOSIS — G479 Sleep disorder, unspecified: Secondary | ICD-10-CM | POA: Diagnosis not present

## 2023-12-09 DIAGNOSIS — E559 Vitamin D deficiency, unspecified: Secondary | ICD-10-CM

## 2023-12-09 DIAGNOSIS — E038 Other specified hypothyroidism: Secondary | ICD-10-CM

## 2023-12-09 DIAGNOSIS — E7849 Other hyperlipidemia: Secondary | ICD-10-CM

## 2023-12-09 DIAGNOSIS — I10 Essential (primary) hypertension: Secondary | ICD-10-CM | POA: Diagnosis not present

## 2023-12-09 DIAGNOSIS — R7301 Impaired fasting glucose: Secondary | ICD-10-CM

## 2023-12-09 MED ORDER — ALPRAZOLAM 0.25 MG PO TABS
0.2500 mg | ORAL_TABLET | Freq: Every evening | ORAL | 0 refills | Status: DC | PRN
Start: 2023-12-09 — End: 2024-04-17

## 2023-12-09 MED ORDER — OLMESARTAN MEDOXOMIL-HCTZ 40-25 MG PO TABS
1.0000 | ORAL_TABLET | Freq: Every day | ORAL | 1 refills | Status: DC
Start: 2023-12-09 — End: 2024-04-17

## 2023-12-09 NOTE — Progress Notes (Unsigned)
 Established Patient Office Visit  Subjective:  Patient ID: Brittany Winters, female    DOB: 1971/06/05  Age: 53 y.o. MRN: [id number]  CC:  Chief Complaint  Patient presents with   Care Management    Follow up. Needs a refill for alprazolam only uses it as needed.     HPI Brittany Winters is a 53 y.o. female with past medical history of hypertension, and sleep disturbance presents for f/u of  chronic medical conditions. For the details of today's visit, please refer to the assessment and plan.     Past Medical History:  Diagnosis Date   Anxiety    Encounter for immunization 07/26/2023   GERD (gastroesophageal reflux disease)    Heart murmur    Hypertension     Past Surgical History:  Procedure Laterality Date   ABLATION  2018   BACK SURGERY  2015   SPINE SURGERY     herniated disc 2015    Family History  Problem Relation Age of Onset   Arthritis Mother    Heart disease Maternal Grandmother    Diabetes Paternal Grandfather     Social History   Socioeconomic History   Marital status: Divorced    Spouse name: Brittany Winters   Number of children: 0   Years of education: 15   Highest education level: Associate degree: academic program  Occupational History   Occupation: Production designer, theatre/television/film  Tobacco Use   Smoking status: Former   Smokeless tobacco: Never   Tobacco comments:    a little in Designer, multimedia   Vaping status: Never Used  Substance and Sexual Activity   Alcohol use: Yes   Drug use: No   Sexual activity: Yes    Birth control/protection: Surgical  Other Topics Concern   Not on file  Social History Narrative   Lives with Brittany Winters   Has "fur babies"   Likes to travel   walks   Social Drivers of Health   Financial Resource Strain: Low Risk  (12/07/2023)   Overall Financial Resource Strain (CARDIA)    Difficulty of Paying Living Expenses: Not hard at all  Food Insecurity: No Food Insecurity (12/07/2023)   Hunger Vital Sign    Worried About Running Out of Food in  the Last Year: Never true    Ran Out of Food in the Last Year: Never true  Transportation Needs: No Transportation Needs (12/07/2023)   PRAPARE - Administrator, Civil Service (Medical): No    Lack of Transportation (Non-Medical): No  Physical Activity: Unknown (12/07/2023)   Exercise Vital Sign    Days of Exercise per Week: 0 days    Minutes of Exercise per Session: Not on file  Stress: No Stress Concern Present (12/07/2023)   [employer] of Occupational Health - Occupational Stress Questionnaire    Feeling of Stress : Only a little  Social Connections: Moderately Integrated (12/07/2023)   Social Connection and Isolation Panel [NHANES]    Frequency of Communication with Friends and Family: More than three times a week    Frequency of Social Gatherings with Friends and Family: Once a week    Attends Religious Services: More than 4 times per year    Active Member of [employer] or Organizations: Yes    Attends Engineer, structural: More than 4 times per year    Marital Status: Divorced  Catering manager Violence: Not on file    Outpatient Medications Prior to Visit  Medication Sig Dispense Refill  Cyanocobalamin (B-12 PO) Take by mouth.     fluticasone (FLONASE) 50 MCG/ACT nasal spray SHAKE LIQUID AND USE 2 SPRAYS IN EACH NOSTRIL DAILY 16 g 2   hydrOXYzine (VISTARIL) 25 MG capsule Take 1 capsule (25 mg total) by mouth every 8 (eight) hours as needed. 30 capsule 1   omeprazole (PRILOSEC) 20 MG capsule Take 1 capsule (20 mg total) by mouth as needed. 90 capsule 3   VITAMIN E COMPLEX PO Take by mouth.     ALPRAZolam (XANAX) 0.25 MG tablet      olmesartan-hydrochlorothiazide (BENICAR HCT) 40-25 MG tablet Take 1 tablet by mouth daily. 60 tablet 1   predniSONE (STERAPRED UNI-PAK 21 TAB) 10 MG (21) TBPK tablet Take following package directions 21 tablet 0   No facility-administered medications prior to visit.    No Known Allergies  ROS Review of Systems   Constitutional:  Negative for chills and fever.  Eyes:  Negative for visual disturbance.  Respiratory:  Negative for chest tightness and shortness of breath.   Neurological:  Negative for dizziness and headaches.      Objective:    Physical Exam HENT:     Head: Normocephalic.     Mouth/Throat:     Mouth: Mucous membranes are moist.  Cardiovascular:     Rate and Rhythm: Normal rate.     Heart sounds: Normal heart sounds.  Pulmonary:     Effort: Pulmonary effort is normal.     Breath sounds: Normal breath sounds.  Neurological:     Mental Status: She is alert.     BP 117/75   Pulse 85   Ht 5' 5.5" (1.664 m)   Wt 166 lb 1.9 oz (75.4 kg)   SpO2 98%   BMI 27.22 kg/m  Wt Readings from Last 3 Encounters:  12/09/23 166 lb 1.9 oz (75.4 kg)  07/26/23 161 lb (73 kg)  06/23/23 163 lb (73.9 kg)    Lab Results  Component Value Date   TSH 2.840 06/16/2023   Lab Results  Component Value Date   WBC 3.0 (L) 06/16/2023   HGB 13.5 06/16/2023   HCT 39.4 06/16/2023   MCV 95 06/16/2023   PLT 330 06/16/2023   Lab Results  Component Value Date   NA 133 (L) 06/16/2023   K 3.8 06/16/2023   CO2 26 06/16/2023   GLUCOSE 99 06/16/2023   BUN 7 06/16/2023   CREATININE 0.75 06/16/2023   BILITOT 1.0 06/16/2023   ALKPHOS 80 06/16/2023   AST 25 06/16/2023   ALT 21 06/16/2023   PROT 7.0 06/16/2023   ALBUMIN 4.9 06/16/2023   CALCIUM 10.3 (H) 06/16/2023   EGFR 96 06/16/2023   Lab Results  Component Value Date   CHOL 202 (H) 06/16/2023   Lab Results  Component Value Date   HDL 105 06/16/2023   Lab Results  Component Value Date   LDLCALC 84 06/16/2023   Lab Results  Component Value Date   TRIG 70 06/16/2023   Lab Results  Component Value Date   CHOLHDL 1.9 06/16/2023   Lab Results  Component Value Date   HGBA1C 5.2 06/16/2023      Assessment & Plan:  Essential hypertension Assessment & Plan: Controlled Encouraged to continue taking  olmesartan-hydrochlorothiazide 40-25 mg daily Low-sodium diet with increased physical activity encouraged BP Readings from Last 3 Encounters:  12/09/23 117/75  07/26/23 116/80  06/23/23 (!) 164/104      Orders: -     Olmesartan Medoxomil-HCTZ; Take 1 tablet  by mouth daily.  Dispense: 90 tablet; Refill: 1  Sleep disturbance Assessment & Plan: The patient is requesting a refill of Xanax 0.5 mg for sleep. Discussed potential side effects, tolerance, and dependency. Informed the patient that I will only prescribe 15 tablets per month. The patient agreed and verbalized understanding. Sleep hygiene practices were reviewed.   Orders: -     ALPRAZolam; Take 1 tablet (0.25 mg total) by mouth at bedtime as needed for anxiety.  Dispense: 15 tablet; Refill: 0  IFG (impaired fasting glucose) -     Hemoglobin A1c  Vitamin D deficiency -     VITAMIN D 25 Hydroxy (Vit-D Deficiency, Fractures)  TSH (thyroid-stimulating hormone deficiency) -     TSH + free T4  Other hyperlipidemia -     Lipid panel -     CMP14+EGFR -     CBC with Differential/Platelet  Note: This chart has been completed using Engineer, civil (consulting) software, and while attempts have been made to ensure accuracy, certain words and phrases may not be transcribed as intended.    Follow-up: Return in about 4 months (around 04/09/2024).   Gilmore Laroche, FNP

## 2023-12-09 NOTE — Patient Instructions (Signed)
 I appreciate the opportunity to provide care to you today!    Follow up:  4 months  Labs: please stop by the lab during the week to get your blood drawn (CBC, CMP, TSH, Lipid profile, HgA1c, Vit D)  Please pick up your refills at the pharmacy  Non-Pharmacological Management for Sleep Hygiene:  Establish a Consistent Bedtime Routine: -Develop and adhere to a regular sleep and wake schedule. -Avoid using electronic devices, including computers and smartphones, at least one hour before bedtime. -If unable to fall asleep within 15 minutes, refrain from staying in bed and engage in a relaxing activity until you feel sleepy. Reduce Daily Stress: -Engage in stress-reducing activities before bedtime to help relax your mind and body. Avoid intense physical exercise and stimulant use, such as caffeine, late in the day. Optimize Sleep Environment: -Use the bed and bedroom exclusively for sleep and intimate activities. -Consider removing electronic devices from the sleeping area and limit screen time prior to bedtime. Incorporate Relaxation Techniques: -Practice abdominal breathing and meditation to promote relaxation. Utilize progressive muscle relaxation and visualization techniques to aid in achieving restful sleep.  Attached with your AVS, you will find valuable resources for self-education. I highly recommend dedicating some time to thoroughly examine them.   Please continue to a heart-healthy diet and increase your physical activities. Try to exercise for at least five days a week.    It was a pleasure to see you and I look forward to continuing to work together on your health and well-being. Please do not hesitate to call the office if you need care or have questions about your care.  In case of emergency, please visit the Emergency Department for urgent care, or contact our clinic at [phone] to schedule an appointment. We're here to help you!   Have a wonderful day and  week. With Gratitude, Gilmore Laroche MSN, FNP-BC

## 2023-12-10 NOTE — Assessment & Plan Note (Signed)
 The patient is requesting a refill of Xanax 0.5 mg for sleep. Discussed potential side effects, tolerance, and dependency. Informed the patient that I will only prescribe 15 tablets per month. The patient agreed and verbalized understanding. Sleep hygiene practices were reviewed.

## 2023-12-10 NOTE — Assessment & Plan Note (Signed)
 Controlled Encouraged to continue taking olmesartan-hydrochlorothiazide 40-25 mg daily Low-sodium diet with increased physical activity encouraged BP Readings from Last 3 Encounters:  12/09/23 117/75  07/26/23 116/80  06/23/23 (!) 164/104

## 2024-02-16 DIAGNOSIS — Z01419 Encounter for gynecological examination (general) (routine) without abnormal findings: Secondary | ICD-10-CM | POA: Diagnosis not present

## 2024-02-16 DIAGNOSIS — Z1231 Encounter for screening mammogram for malignant neoplasm of breast: Secondary | ICD-10-CM | POA: Diagnosis not present

## 2024-02-16 DIAGNOSIS — Z6827 Body mass index (BMI) 27.0-27.9, adult: Secondary | ICD-10-CM | POA: Diagnosis not present

## 2024-02-16 DIAGNOSIS — Z124 Encounter for screening for malignant neoplasm of cervix: Secondary | ICD-10-CM | POA: Diagnosis not present

## 2024-02-16 DIAGNOSIS — N951 Menopausal and female climacteric states: Secondary | ICD-10-CM | POA: Diagnosis not present

## 2024-03-20 ENCOUNTER — Other Ambulatory Visit: Payer: Self-pay | Admitting: Family Medicine

## 2024-03-20 DIAGNOSIS — J302 Other seasonal allergic rhinitis: Secondary | ICD-10-CM

## 2024-04-10 ENCOUNTER — Ambulatory Visit: Admitting: Family Medicine

## 2024-04-11 DIAGNOSIS — E559 Vitamin D deficiency, unspecified: Secondary | ICD-10-CM | POA: Diagnosis not present

## 2024-04-11 DIAGNOSIS — R7301 Impaired fasting glucose: Secondary | ICD-10-CM | POA: Diagnosis not present

## 2024-04-11 DIAGNOSIS — E7849 Other hyperlipidemia: Secondary | ICD-10-CM | POA: Diagnosis not present

## 2024-04-11 DIAGNOSIS — E038 Other specified hypothyroidism: Secondary | ICD-10-CM | POA: Diagnosis not present

## 2024-04-12 LAB — CBC WITH DIFFERENTIAL/PLATELET
Basophils Absolute: 0 x10E3/uL (ref 0.0–0.2)
Basos: 1 %
EOS (ABSOLUTE): 0.1 x10E3/uL (ref 0.0–0.4)
Eos: 2 %
Hematocrit: 35.6 % (ref 34.0–46.6)
Hemoglobin: 11.8 g/dL (ref 11.1–15.9)
Immature Grans (Abs): 0 x10E3/uL (ref 0.0–0.1)
Immature Granulocytes: 0 %
Lymphocytes Absolute: 1.1 x10E3/uL (ref 0.7–3.1)
Lymphs: 36 %
MCH: 32.6 pg (ref 26.6–33.0)
MCHC: 33.1 g/dL (ref 31.5–35.7)
MCV: 98 fL — ABNORMAL HIGH (ref 79–97)
Monocytes Absolute: 0.4 x10E3/uL (ref 0.1–0.9)
Monocytes: 12 %
Neutrophils Absolute: 1.4 x10E3/uL (ref 1.4–7.0)
Neutrophils: 48 %
Platelets: 368 x10E3/uL (ref 150–450)
RBC: 3.62 x10E6/uL — ABNORMAL LOW (ref 3.77–5.28)
RDW: 12.6 % (ref 11.7–15.4)
WBC: 3 x10E3/uL — ABNORMAL LOW (ref 3.4–10.8)

## 2024-04-12 LAB — LIPID PANEL
Chol/HDL Ratio: 2.1 ratio (ref 0.0–4.4)
Cholesterol, Total: 187 mg/dL (ref 100–199)
HDL: 91 mg/dL (ref >39)
LDL Chol Calc (NIH): 86 mg/dL (ref 0–99)
Triglycerides: 51 mg/dL (ref 0–149)
VLDL Cholesterol Cal: 10 mg/dL (ref 5–40)

## 2024-04-12 LAB — CMP14+EGFR
ALT: 20 IU/L (ref 0–32)
AST: 18 IU/L (ref 0–40)
Albumin: 4.6 g/dL (ref 3.8–4.9)
Alkaline Phosphatase: 76 IU/L (ref 44–121)
BUN/Creatinine Ratio: 14 (ref 9–23)
BUN: 10 mg/dL (ref 6–24)
Bilirubin Total: 0.3 mg/dL (ref 0.0–1.2)
CO2: 23 mmol/L (ref 20–29)
Calcium: 9.7 mg/dL (ref 8.7–10.2)
Chloride: 91 mmol/L — ABNORMAL LOW (ref 96–106)
Creatinine, Ser: 0.74 mg/dL (ref 0.57–1.00)
Globulin, Total: 2.2 g/dL (ref 1.5–4.5)
Glucose: 78 mg/dL (ref 70–99)
Potassium: 4.4 mmol/L (ref 3.5–5.2)
Sodium: 129 mmol/L — ABNORMAL LOW (ref 134–144)
Total Protein: 6.8 g/dL (ref 6.0–8.5)
eGFR: 98 mL/min/1.73 (ref >59)

## 2024-04-12 LAB — TSH+FREE T4
Free T4: 1.1 ng/dL (ref 0.82–1.77)
TSH: 1.98 u[IU]/mL (ref 0.450–4.500)

## 2024-04-12 LAB — HEMOGLOBIN A1C
Est. average glucose Bld gHb Est-mCnc: 105 mg/dL
Hgb A1c MFr Bld: 5.3 % (ref 4.8–5.6)

## 2024-04-12 LAB — VITAMIN D 25 HYDROXY (VIT D DEFICIENCY, FRACTURES): Vit D, 25-Hydroxy: 74 ng/mL (ref 30.0–100.0)

## 2024-04-16 ENCOUNTER — Ambulatory Visit: Payer: Self-pay | Admitting: Family Medicine

## 2024-04-17 ENCOUNTER — Other Ambulatory Visit: Payer: Self-pay | Admitting: Family Medicine

## 2024-04-17 DIAGNOSIS — K219 Gastro-esophageal reflux disease without esophagitis: Secondary | ICD-10-CM

## 2024-04-17 DIAGNOSIS — G479 Sleep disorder, unspecified: Secondary | ICD-10-CM

## 2024-04-17 DIAGNOSIS — I10 Essential (primary) hypertension: Secondary | ICD-10-CM

## 2024-04-17 MED ORDER — OLMESARTAN MEDOXOMIL-HCTZ 40-25 MG PO TABS: 1.0000 | ORAL_TABLET | Freq: Every day | ORAL | 1 refills | Status: DC

## 2024-04-17 MED ORDER — OMEPRAZOLE 20 MG PO CPDR: 20.0000 mg | DELAYED_RELEASE_CAPSULE | ORAL | 3 refills | Status: DC | PRN

## 2024-04-17 MED ORDER — ALPRAZOLAM 0.25 MG PO TABS: 0.2500 mg | ORAL_TABLET | Freq: Every evening | ORAL | 0 refills | Status: DC | PRN

## 2024-04-17 NOTE — Telephone Encounter (Signed)
 Copied from CRM [id number]. Topic: Clinical - Medication Refill >> Apr 17, 2024  9:26 AM Tobias L wrote: Medication: olmesartan -hydrochlorothiazide (BENICAR  HCT) 40-25 MG table omeprazole  (PRILOSEC) 20 MG capsule ALPRAZolam  (XANAX ) 0.25 MG tablet   Patient has upcoming appointment with [facility] on 06/30/2024 requesting refills until able to be seen.  Patient has had multiple appointments canceled by office due to error in scheduling. Patient also on waitlist.   Has the patient contacted their pharmacy? No Patient needing appointment, decided to schedule and call office directly.   This is the patient's preferred pharmacy:  [facility] #[id number] - [place], [place] - [address] [place] [address] Phone: [phone] Fax: [phone]  Is this the correct pharmacy for this prescription? Yes  Has the prescription been filled recently? No  Is the patient out of the medication? No  Has the patient been seen for an appointment in the last year OR does the patient have an upcoming appointment? Yes  Can we respond through MyChart? Yes  Agent: Please be advised that Rx refills may take up to 3 business days. We ask that you follow-up with your pharmacy.

## 2024-04-19 ENCOUNTER — Ambulatory Visit: Admitting: Family Medicine

## 2024-06-22 ENCOUNTER — Other Ambulatory Visit: Payer: Self-pay | Admitting: Family Medicine

## 2024-06-22 DIAGNOSIS — K219 Gastro-esophageal reflux disease without esophagitis: Secondary | ICD-10-CM

## 2024-06-30 ENCOUNTER — Encounter: Payer: Self-pay | Admitting: Family Medicine

## 2024-06-30 ENCOUNTER — Ambulatory Visit: Payer: Self-pay | Admitting: Family Medicine

## 2024-06-30 VITALS — BP 121/80 | HR 90 | Resp 16 | Ht 65.5 in | Wt 161.0 lb

## 2024-06-30 DIAGNOSIS — G479 Sleep disorder, unspecified: Secondary | ICD-10-CM

## 2024-06-30 DIAGNOSIS — N951 Menopausal and female climacteric states: Secondary | ICD-10-CM

## 2024-06-30 MED ORDER — HYDROXYZINE PAMOATE 25 MG PO CAPS: 25.0000 mg | ORAL_CAPSULE | Freq: Three times a day (TID) | ORAL | 2 refills | Status: AC | PRN

## 2024-06-30 MED ORDER — PAROXETINE HCL 20 MG PO TABS: 20.0000 mg | ORAL_TABLET | Freq: Every day | ORAL | 1 refills | Status: AC

## 2024-06-30 MED ORDER — ALPRAZOLAM 0.25 MG PO TABS: 0.2500 mg | ORAL_TABLET | Freq: Every evening | ORAL | 0 refills | Status: AC | PRN

## 2024-06-30 NOTE — Progress Notes (Signed)
 Established Patient Office Visit  Subjective:  Patient ID: Brittany Winters, female    DOB: [birth year]  Age: 53 y.o. MRN: [id number]  CC:  Chief Complaint  Patient presents with   Hypertension    Follow up     HPI Brittany Winters is a 53 y.o. female with past medical history of  hypertension, sleep disturbance presents for f/u of  chronic medical conditions.  For the details of today's visit, please refer to the assessment and plan.     Past Medical History:  Diagnosis Date   Anxiety    Encounter for immunization 07/26/2023   GERD (gastroesophageal reflux disease)    Heart murmur    Hypertension     Past Surgical History:  Procedure Laterality Date   ABLATION  2018   BACK SURGERY  2015   SPINE SURGERY     herniated disc 2015    Family History  Problem Relation Age of Onset   Arthritis Mother    Heart disease Maternal Grandmother    Diabetes Paternal Grandfather     Social History   Socioeconomic History   Marital status: Divorced    Spouse name: Stacy   Number of children: 0   Years of education: 15   Highest education level: Associate degree: academic program  Occupational History   Occupation: Production designer, theatre/television/film  Tobacco Use   Smoking status: Former   Smokeless tobacco: Never   Tobacco comments:    a little in Designer, multimedia   Vaping status: Never Used  Substance and Sexual Activity   Alcohol use: Yes   Drug use: No   Sexual activity: Yes    Birth control/protection: Surgical  Other Topics Concern   Not on file  Social History Narrative   Lives with Glade   Has fur babies   Likes to travel   walks   Social Drivers of Health   Financial Resource Strain: Low Risk  (12/07/2023)   Overall Financial Resource Strain (CARDIA)    Difficulty of Paying Living Expenses: Not hard at all  Food Insecurity: No Food Insecurity (12/07/2023)   Hunger Vital Sign    Worried About Running Out of Food in the Last Year: Never true    Ran Out of Food in the Last  Year: Never true  Transportation Needs: No Transportation Needs (12/07/2023)   PRAPARE - Administrator, Civil Service (Medical): No    Lack of Transportation (Non-Medical): No  Physical Activity: Unknown (12/07/2023)   Exercise Vital Sign    Days of Exercise per Week: 0 days    Minutes of Exercise per Session: Not on file  Stress: No Stress Concern Present (12/07/2023)   [employer] of Occupational Health - Occupational Stress Questionnaire    Feeling of Stress : Only a little  Social Connections: Moderately Integrated (12/07/2023)   Social Connection and Isolation Panel    Frequency of Communication with Friends and Family: More than three times a week    Frequency of Social Gatherings with Friends and Family: Once a week    Attends Religious Services: More than 4 times per year    Active Member of [employer] or Organizations: Yes    Attends Engineer, structural: More than 4 times per year    Marital Status: Divorced  Catering manager Violence: Not on file    Outpatient Medications Prior to Visit  Medication Sig Dispense Refill   Cyanocobalamin (B-12 PO) Take by mouth.  fluticasone  (FLONASE ) 50 MCG/ACT nasal spray SHAKE LIQUID AND USE 2 SPRAYS IN EACH NOSTRIL DAILY 16 g 2   olmesartan -hydrochlorothiazide (BENICAR  HCT) 40-25 MG tablet Take 1 tablet by mouth daily. 90 tablet 1   omeprazole  (PRILOSEC) 20 MG capsule TAKE 1 CAPSULE BY MOUTH EVERY DAY AS NEEDED 90 capsule 3   VITAMIN E COMPLEX PO Take by mouth.     ALPRAZolam  (XANAX ) 0.25 MG tablet Take 1 tablet (0.25 mg total) by mouth at bedtime as needed for anxiety. 15 tablet 0   hydrOXYzine  (VISTARIL ) 25 MG capsule Take 1 capsule (25 mg total) by mouth every 8 (eight) hours as needed. 30 capsule 1   No facility-administered medications prior to visit.    No Known Allergies  ROS Review of Systems  Constitutional:  Negative for chills and fever.  Eyes:  Negative for visual disturbance.  Respiratory:   Negative for chest tightness and shortness of breath.   Neurological:  Negative for dizziness and headaches.      Objective:    Physical Exam HENT:     Head: Normocephalic.     Mouth/Throat:     Mouth: Mucous membranes are moist.  Cardiovascular:     Rate and Rhythm: Normal rate.     Heart sounds: Normal heart sounds.  Pulmonary:     Effort: Pulmonary effort is normal.     Breath sounds: Normal breath sounds.  Neurological:     Mental Status: She is alert.     BP 121/80   Pulse 90   Resp 16   Ht 5' 5.5 (1.664 m)   Wt 161 lb 0.6 oz (73 kg)   SpO2 96%   BMI 26.39 kg/m  Wt Readings from Last 3 Encounters:  06/30/24 161 lb 0.6 oz (73 kg)  12/09/23 166 lb 1.9 oz (75.4 kg)  07/26/23 161 lb (73 kg)    Lab Results  Component Value Date   TSH 1.980 04/11/2024   Lab Results  Component Value Date   WBC 3.0 (L) 04/11/2024   HGB 11.8 04/11/2024   HCT 35.6 04/11/2024   MCV 98 (H) 04/11/2024   PLT 368 04/11/2024   Lab Results  Component Value Date   NA 129 (L) 04/11/2024   K 4.4 04/11/2024   CO2 23 04/11/2024   GLUCOSE 78 04/11/2024   BUN 10 04/11/2024   CREATININE 0.74 04/11/2024   BILITOT 0.3 04/11/2024   ALKPHOS 76 04/11/2024   AST 18 04/11/2024   ALT 20 04/11/2024   PROT 6.8 04/11/2024   ALBUMIN 4.6 04/11/2024   CALCIUM 9.7 04/11/2024   EGFR 98 04/11/2024   Lab Results  Component Value Date   CHOL 187 04/11/2024   Lab Results  Component Value Date   HDL 91 04/11/2024   Lab Results  Component Value Date   LDLCALC 86 04/11/2024   Lab Results  Component Value Date   TRIG 51 04/11/2024   Lab Results  Component Value Date   CHOLHDL 2.1 04/11/2024   Lab Results  Component Value Date   HGBA1C 5.3 04/11/2024      Assessment & Plan:  Vasomotor symptoms due to menopause Assessment & Plan: The patient reports vasomotor symptoms of menopause, including hot flashes, night sweats, mood changes, and sleep disturbances. She will begin paroxetine 20  mg daily, as preferred by her insurance. She was advised to monitor for side effects such as nausea, headache, dry mouth, drowsiness, dizziness, sexual dysfunction, and increased sweating, and to report any severe mood  changes, unusual bleeding, or signs of serotonin syndrome.   Orders: -     PARoxetine HCl; Take 1 tablet (20 mg total) by mouth daily.  Dispense: 90 tablet; Refill: 1  Sleep disturbance Assessment & Plan: Refilled hydroxyzine     Orders: -     hydrOXYzine  Pamoate; Take 1 capsule (25 mg total) by mouth every 8 (eight) hours as needed.  Dispense: 60 capsule; Refill: 2 -     ALPRAZolam ; Take 1 tablet (0.25 mg total) by mouth at bedtime as needed for anxiety.  Dispense: 20 tablet; Refill: 0  Note: This chart has been completed using Engineer, civil (consulting) software, and while attempts have been made to ensure accuracy, certain words and phrases may not be transcribed as intended.    Follow-up: Return in about 4 months (around 10/31/2024).   Akia Desroches  Z Bacchus, FNP

## 2024-06-30 NOTE — Assessment & Plan Note (Signed)
Refilled hydroxyzine.

## 2024-06-30 NOTE — Assessment & Plan Note (Signed)
 The patient reports vasomotor symptoms of menopause, including hot flashes, night sweats, mood changes, and sleep disturbances. She will begin paroxetine 20 mg daily, as preferred by her insurance. She was advised to monitor for side effects such as nausea, headache, dry mouth, drowsiness, dizziness, sexual dysfunction, and increased sweating, and to report any severe mood changes, unusual bleeding, or signs of serotonin syndrome.

## 2024-06-30 NOTE — Patient Instructions (Addendum)
 I appreciate the opportunity to provide care to you today!    Follow up:  4 months  Labs: next visit  Vasomotor symptoms of menopause -Initiate paroxetine 20 mg daily.  Please monitor for side effects.  Common side effects of paroxetine may include: -Nausea or upset stomach -Drowsiness or insomnia -Dry mouth -Headache -Dizziness   Please follow up if your symptoms worsen or fail to improve.  For a Healthier YOU, I Recommend: Reducing your intake of sugar, sodium, carbohydrates, and saturated fats. Increasing your fiber intake by incorporating more whole grains, fruits, and vegetables into your meals. Setting healthy goals with a focus on lowering your consumption of carbs, sugar, and unhealthy fats. Adding variety to your diet by including a wide range of fruits and vegetables. Cutting back on soda and limiting processed foods as much as possible. Staying active: In addition to taking your weight loss medication, aim for at least 150 minutes of moderate-intensity physical activity each week for optimal results.    Please continue to a heart-healthy diet and increase your physical activities. Try to exercise for at least five days a week.    It was a pleasure to see you and I look forward to continuing to work together on your health and well-being. Please do not hesitate to call the office if you need care or have questions about your care.  In case of emergency, please visit the Emergency Department for urgent care, or contact our clinic at [phone] to schedule an appointment. We're here to help you!   Have a wonderful day and week. With Gratitude, Meade JENEANE Gerlach MSN, FNP-BC, PMHNP-BC

## 2024-09-25 ENCOUNTER — Telehealth: Payer: Self-pay

## 2024-09-25 NOTE — Telephone Encounter (Signed)
 Copied from CRM #[id number]. Topic: Appointments - Appointment Scheduling >> Sep 22, 2024 12:19 PM Antony RAMAN wrote: Calling to cancel her 11/07/24 appointment but I was unable to cancel it. It is rescheduled to a video visit on 2/5

## 2024-10-10 ENCOUNTER — Other Ambulatory Visit: Payer: Self-pay | Admitting: Family Medicine

## 2024-10-10 DIAGNOSIS — J302 Other seasonal allergic rhinitis: Secondary | ICD-10-CM

## 2024-10-12 ENCOUNTER — Other Ambulatory Visit: Payer: Self-pay | Admitting: Family Medicine

## 2024-10-12 DIAGNOSIS — J302 Other seasonal allergic rhinitis: Secondary | ICD-10-CM

## 2024-11-02 ENCOUNTER — Telehealth: Payer: Self-pay | Admitting: Family Medicine

## 2024-11-02 DIAGNOSIS — J302 Other seasonal allergic rhinitis: Secondary | ICD-10-CM

## 2024-11-02 DIAGNOSIS — I10 Essential (primary) hypertension: Secondary | ICD-10-CM

## 2024-11-02 MED ORDER — FLUTICASONE PROPIONATE 50 MCG/ACT NA SUSP: NASAL | 2 refills | Status: AC

## 2024-11-02 MED ORDER — OLMESARTAN MEDOXOMIL-HCTZ 40-25 MG PO TABS: 1.0000 | ORAL_TABLET | Freq: Every day | ORAL | 1 refills | Status: AC

## 2024-11-02 NOTE — Progress Notes (Unsigned)
" ° °  Virtual Visit via Video Note  I connected with Brittany Winters on 11/02/24 at  4:00 PM EST by a video enabled telemedicine application and verified that I am speaking with the correct person using two identifiers.  Patient Location: Home Provider Location: Home Office  I discussed the limitations, risks, security, and privacy concerns of performing an evaluation and management service by video and the availability of in person appointments. I also discussed with the patient that there may be a patient responsible charge related to this service. The patient expressed understanding and agreed to proceed.  Subjective: PCP: Edman Meade PEDLAR, FNP  Chief Complaint  Patient presents with   Medical Management of Chronic Issues    Follow up    HPI   ROS: Per HPI Current Medications[1]  Observations/Objective: There were no vitals filed for this visit. Physical Exam  Assessment and Plan: There are no diagnoses linked to this encounter.  Follow Up Instructions: No follow-ups on file.   I discussed the assessment and treatment plan with the patient. The patient was provided an opportunity to ask questions, and all were answered. The patient agreed with the plan and demonstrated an understanding of the instructions.   The patient was advised to call back or seek an in-person evaluation if the symptoms worsen or if the condition fails to improve as anticipated.  The above assessment and management plan was discussed with the patient. The patient verbalized understanding of and has agreed to the management plan.   Meade PEDLAR Edman, FNP    [1]  Current Outpatient Medications:    ALPRAZolam  (XANAX ) 0.25 MG tablet, Take 1 tablet (0.25 mg total) by mouth at bedtime as needed for anxiety., Disp: 20 tablet, Rfl: 0   Cyanocobalamin (B-12 PO), Take by mouth., Disp: , Rfl:    fluticasone  (FLONASE ) 50 MCG/ACT nasal spray, SHAKE LIQUID AND USE 2 SPRAYS IN EACH NOSTRIL DAILY, Disp: 16 g,  Rfl: 2   hydrOXYzine  (VISTARIL ) 25 MG capsule, Take 1 capsule (25 mg total) by mouth every 8 (eight) hours as needed., Disp: 60 capsule, Rfl: 2   olmesartan -hydrochlorothiazide (BENICAR  HCT) 40-25 MG tablet, Take 1 tablet by mouth daily., Disp: 90 tablet, Rfl: 1   omeprazole  (PRILOSEC) 20 MG capsule, TAKE 1 CAPSULE BY MOUTH EVERY DAY AS NEEDED, Disp: 90 capsule, Rfl: 3   PARoxetine  (PAXIL ) 20 MG tablet, Take 1 tablet (20 mg total) by mouth daily., Disp: 90 tablet, Rfl: 1   VITAMIN E COMPLEX PO, Take by mouth., Disp: , Rfl:   "

## 2024-11-07 ENCOUNTER — Ambulatory Visit: Admitting: Family Medicine

## 2025-05-02 ENCOUNTER — Ambulatory Visit: Payer: Self-pay | Admitting: Nurse Practitioner
